# Patient Record
Sex: Male | Born: 1955 | Race: White | Hispanic: No | Marital: Married | Smoking: Never smoker
Health system: Southern US, Community
[De-identification: ages and names within clinical notes are randomized; demographics above are authoritative.]

## PROBLEM LIST (undated history)

## (undated) DIAGNOSIS — N400 Enlarged prostate without lower urinary tract symptoms: Secondary | ICD-10-CM

## (undated) DIAGNOSIS — K259 Gastric ulcer, unspecified as acute or chronic, without hemorrhage or perforation: Secondary | ICD-10-CM

## (undated) DIAGNOSIS — Z681 Body mass index (BMI) 19 or less, adult: Secondary | ICD-10-CM

## (undated) DIAGNOSIS — D4709 Other mast cell neoplasms of uncertain behavior: Secondary | ICD-10-CM

## (undated) DIAGNOSIS — K269 Duodenal ulcer, unspecified as acute or chronic, without hemorrhage or perforation: Secondary | ICD-10-CM

## (undated) DIAGNOSIS — R972 Elevated prostate specific antigen [PSA]: Secondary | ICD-10-CM

## (undated) DIAGNOSIS — R21 Rash and other nonspecific skin eruption: Secondary | ICD-10-CM

## (undated) DIAGNOSIS — E7800 Pure hypercholesterolemia, unspecified: Secondary | ICD-10-CM

## (undated) DIAGNOSIS — K649 Unspecified hemorrhoids: Secondary | ICD-10-CM

## (undated) DIAGNOSIS — K219 Gastro-esophageal reflux disease without esophagitis: Secondary | ICD-10-CM

## (undated) DIAGNOSIS — F419 Anxiety disorder, unspecified: Secondary | ICD-10-CM

## (undated) DIAGNOSIS — L509 Urticaria, unspecified: Secondary | ICD-10-CM

## (undated) DIAGNOSIS — I8390 Asymptomatic varicose veins of unspecified lower extremity: Secondary | ICD-10-CM

## (undated) DIAGNOSIS — F32A Depression, unspecified: Secondary | ICD-10-CM

## (undated) HISTORY — DX: Duodenal ulcer, unspecified as acute or chronic, without hemorrhage or perforation: K26.9

## (undated) HISTORY — DX: Other mast cell neoplasms of uncertain behavior: D47.09

## (undated) HISTORY — DX: Asymptomatic varicose veins of unspecified lower extremity: I83.90

## (undated) HISTORY — DX: Benign prostatic hyperplasia without lower urinary tract symptoms: N40.0

## (undated) HISTORY — DX: Pure hypercholesterolemia, unspecified: E78.00

## (undated) HISTORY — DX: Elevated prostate specific antigen (PSA): R97.20

## (undated) HISTORY — DX: Urticaria, unspecified: L50.9

## (undated) HISTORY — DX: Body mass index (BMI) 19.9 or less, adult: Z68.1

## (undated) HISTORY — DX: Depression, unspecified: F32.A

## (undated) HISTORY — DX: Gastric ulcer, unspecified as acute or chronic, without hemorrhage or perforation: K25.9

## (undated) HISTORY — DX: Rash and other nonspecific skin eruption: R21

## (undated) HISTORY — DX: Unspecified hemorrhoids: K64.9

## (undated) HISTORY — PX: VASECTOMY: SHX75

## (undated) HISTORY — DX: Anxiety disorder, unspecified: F41.9

---

## 2003-10-04 ENCOUNTER — Encounter: Admission: RE | Admit: 2003-10-04 | Discharge: 2003-10-04 | Payer: Self-pay | Admitting: Family Medicine

## 2003-10-06 ENCOUNTER — Encounter: Admission: RE | Admit: 2003-10-06 | Discharge: 2003-10-06 | Payer: Self-pay | Admitting: Family Medicine

## 2003-10-13 ENCOUNTER — Inpatient Hospital Stay (HOSPITAL_COMMUNITY): Admission: RE | Admit: 2003-10-13 | Discharge: 2003-10-14 | Payer: Self-pay | Admitting: Neurosurgery

## 2003-11-26 HISTORY — PX: NECK SURGERY: SHX720

## 2007-12-05 ENCOUNTER — Emergency Department (HOSPITAL_COMMUNITY): Admission: EM | Admit: 2007-12-05 | Discharge: 2007-12-06 | Payer: Self-pay | Admitting: Emergency Medicine

## 2010-12-15 ENCOUNTER — Encounter: Payer: Self-pay | Admitting: Family Medicine

## 2011-04-12 NOTE — Op Note (Signed)
Joshua Haney, Joshua Haney                          ACCOUNT NO.:  [id number]   MEDICAL RECORD NO.:  [id number]                   PATIENT TYPE:  INP   LOCATION:  2899                                 FACILITY:  [facility]   PHYSICIAN:  Clydene Fake, M.D.               DATE OF BIRTH:  1956/07/14   DATE OF PROCEDURE:  10/12/2003  DATE OF DISCHARGE:                                 OPERATIVE REPORT   PREOPERATIVE DIAGNOSIS:  Herniated nucleus pulposus and spondylosis of C5-C6  and C6-C7 with right sided radiculopathy.   POSTOPERATIVE DIAGNOSIS:  Herniated nucleus pulposus and spondylosis of C5-  C6 and C6-C7 with right sided radiculopathy.   PROCEDURE:  Anterior cervical decompression and discectomy C5-C6 and C6-C7  with LifeNet allograft bone and tethered anterior cervical plate.   SURGEON:  Clydene Fake, M.D.   ASSISTANT:  Tressie Stalker, M.D.   ANESTHESIA:  General endotracheal anesthesia.   ESTIMATED BLOOD LOSS:  Minimal, no blood given.   DRAINS:  None.   COMPLICATIONS:  None.   REASON FOR PROCEDURE:  The patient is a 55 year old gentleman who has had  neck pain and intermittent right arm tingling in the past when he was hit  with a garage door on his right arm and had severe right sided neck pain  with pain radiating down his arm with numbness and weakness on the right  side since.  The pain has subsided a little bit, but the weakness persists  and has actually maybe progressed over the last week.  MRI was done showing  severe spondylitic changes at C5-C6 and C6-C7, worse on the left at 6-7,  wide prominent area at +6 with a large right sided foraminal disc herniation  at 5-6, there is some spondylitic changes at 4-5, but that is small.  The  patient is brought in for decompression and fusion at 5-6 and 6-7.   PROCEDURE IN DETAIL:  The patient was brought to the operating room, general  anesthesia was induced, the patient was placed in 10 pounds of halter  traction and was  prepped and draped in the usual sterile fashion.  The site  of incision was injected with 10 cc of 1% lidocaine with epinephrine.  The  incision was then made from the midline to the anterior border of the  sternocleidomastoid muscle.  The incision was taken to the platysma and this  was incised with the Bovie.  Blunt dissection was taken through the anterior  cervical fascia to the anterior cervical spine.  Exposure of the anterior  spine was performed, again with blunt dissection.  A needle was placed in  the interspace and x-ray was obtained showing that this was at the 5-6  interspace.  The disc space was incised with a 15 blade as the needle was  removed and partial discectomy performed with pituitary rongeurs.  The  longus colli muscle was reflected laterally  on each side from C5 through C7  and a self-retaining retractor was placed.  Again the disc spaces of 5-6 and  6-7 were incised with a 15 blade and discectomy performed with pituitary  rongeurs and curets.  The microscope was then brought in for the  microdissection at this point starting at the 5-6 level. The high speed  drill was used to remove cartilaginous end plates. ___________ vertebral  joints, and then 1 and 2 mm Kerrison punches were then used to decompress  ___________ bilateral foramen removing posterior ligament, the rest of the  disc and the posterior spurs.  Bilateral foraminotomies were performed and  free fragments of disc were removed from the right foramen far lateral when  there appears to be good decompression of the bilateral nerve roots and  central canal.  The disc space was measured with the LifeNet ___________  trial and measured to be 6 mm.  We roughed up the endplates with a broach  and after we got hemostasis with Gelfoam and thrombin and irrigating the  Gelfoam out, the 6 mm LifeNet allograft bone was tapped into place.   Attention was then taken to C6-C7 and, again, discectomy was performed with   pituitary rongeurs and curets, 1 and 2 mm Kerrison punches were used to  remove posterior ligament and posterior osteophytes and bilateral  foraminotomies were performed.  The height of the disc space was measured to  be 7 mm.  I removed the cartilaginous end plate with a high speed drill.  I  then used the 7 mm LifeNet broach and then tapped a 7 mm LifeNet allograft  bone into place countersinking a couple of millimeters.  There was plenty of  room between bone and dura, checked with a nerve hook.  The wound was  irrigated with antibiotic solution.  Distraction pins were placed in C5 and  C7 and the interspace was distracted during all this discectomy and  placement of the bone plugs.  The distraction pins were now removed and  weight was removed from the traction.  Hemostasis was obtained with Gelfoam  and thrombin and when this was all irrigated out and removed, a tethered  anterior cervical plate was placed over the anterior cervical spine with two  screws placed in C5, one in C6, and two into C7.  These were tightened down.  A lateral x-ray was obtained showing good position of plate and screws  interbody bone plug C5 through C7.  The wounds were irrigated out with  antibiotic solution, retractors were removed.  Hemostasis was obtained with  bipolar cauterization and Gelfoam and thrombin.  Gelfoam was removed with  good hemostasis.  The platysma was closed with 3-0 Vicryl interrupted  sutures, the subcutaneous tissue were closed with the same, the skin was  closed with Benzoin and Steri-Strips.  A dressing was placed and the patient  was placed in a soft cervical collar, awakened from anesthesia and  transferred to the recovery room in stable condition.                                               Clydene Fake, M.D.    JRH/MEDQ  D:  10/13/2003  T:  10/13/2003  Job:  [id number]

## 2016-04-04 DIAGNOSIS — Z8711 Personal history of peptic ulcer disease: Secondary | ICD-10-CM | POA: Diagnosis not present

## 2016-05-27 DIAGNOSIS — Z0000 Encounter for general adult medical examination without abnormal findings: Secondary | ICD-10-CM | POA: Diagnosis not present

## 2016-06-04 DIAGNOSIS — Z0000 Encounter for general adult medical examination without abnormal findings: Secondary | ICD-10-CM | POA: Diagnosis not present

## 2016-06-04 DIAGNOSIS — Z6825 Body mass index (BMI) 25.0-25.9, adult: Secondary | ICD-10-CM | POA: Diagnosis not present

## 2016-06-04 DIAGNOSIS — K257 Chronic gastric ulcer without hemorrhage or perforation: Secondary | ICD-10-CM | POA: Diagnosis not present

## 2016-06-04 DIAGNOSIS — Z1389 Encounter for screening for other disorder: Secondary | ICD-10-CM | POA: Diagnosis not present

## 2016-09-16 DIAGNOSIS — Z23 Encounter for immunization: Secondary | ICD-10-CM | POA: Diagnosis not present

## 2016-11-12 DIAGNOSIS — H5203 Hypermetropia, bilateral: Secondary | ICD-10-CM | POA: Diagnosis not present

## 2016-11-25 HISTORY — PX: BONE MARROW BIOPSY: SHX199

## 2016-12-14 DIAGNOSIS — T7840XA Allergy, unspecified, initial encounter: Secondary | ICD-10-CM | POA: Diagnosis not present

## 2016-12-24 ENCOUNTER — Ambulatory Visit (INDEPENDENT_AMBULATORY_CARE_PROVIDER_SITE_OTHER): Payer: BLUE CROSS/BLUE SHIELD | Admitting: Allergy

## 2016-12-24 ENCOUNTER — Encounter: Payer: Self-pay | Admitting: Allergy

## 2016-12-24 VITALS — BP 130/82 | HR 80 | Temp 98.1°F | Resp 18 | Ht 66.0 in | Wt 158.0 lb

## 2016-12-24 DIAGNOSIS — L500 Allergic urticaria: Secondary | ICD-10-CM | POA: Diagnosis not present

## 2016-12-24 DIAGNOSIS — J3089 Other allergic rhinitis: Secondary | ICD-10-CM | POA: Diagnosis not present

## 2016-12-24 DIAGNOSIS — T7840XA Allergy, unspecified, initial encounter: Secondary | ICD-10-CM | POA: Diagnosis not present

## 2016-12-24 MED ORDER — EPINEPHRINE 0.3 MG/0.3ML IJ SOAJ: INTRAMUSCULAR | 3 refills | Status: DC

## 2016-12-24 MED ORDER — AUVI-Q 0.3 MG/0.3ML IJ SOAJ: INTRAMUSCULAR | 3 refills | Status: DC

## 2016-12-24 NOTE — Progress Notes (Signed)
New Patient Note  RE: Joshua Haney MRN: [id number] DOB: Jan 25, 1956 Date of Office Visit: 12/24/2016  Referring provider: No ref. provider found Primary care provider: Leonides Sake, MD  Chief Complaint: Rash  History of present illness: Joshua Haney is a 61 y.o. male presenting today for evaluation of rash. He was recommended to see an allergist by the urgent care.   He presents today with his wife.   A week ago he started having a rash.  The rash started on on Friday, 12/13/2016 after he ate cashews.   His wife reports that they don't routinely cashews and typically eat them around the holidays.  The rash started more on his chest and back on Friday.  By the next day the rash was "everywhere".   Wife states the rash looked like hives initially and the hives spread to become large patches of rash.  Rash was very itchy.  [name] reports if he scratched and the rash worsened. Took rash about a week about to completely resolve.  No associated swelling, fever, joint pain. Rash did not leave any marks or bruising once resolved.   She had been using Caladryl on the skin without much relief. He did go to the urgent care on Saturday who prescribed prednisone for 12 days and received today steroid injection.  They also prescribed zantac and benadryl.  Sunday he had red meat in his lunch and supper.   Thus his wife is concerned about possible red meat allergy. About an hour after supper the rash flared up again.  No swelling, difficulty swallowing, respiratory, CV or GI related symptoms.  He denies any new medication, preceding illnesses, stings, changes in soaps or lotions or detergents. He does have a history of tick bites.   He had a bout of hives 2-3 years ago.   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 He reports there is a lot of dust in the year.  Wife reports he does have history of sinus infection.  He has taken Allegra-D in the past to help with his nasal  congestion symptoms.  Review of systems: Review of Systems  Constitutional: Negative for chills, fever and malaise/fatigue.  HENT: Positive for congestion. Negative for ear pain, nosebleeds, sinus pain and sore throat.   Eyes: Negative for discharge and redness.  Respiratory: Negative for cough and shortness of breath.   Cardiovascular: Negative for chest pain.  Gastrointestinal: Positive for heartburn (History of stomach ulcer). Negative for abdominal pain, nausea and vomiting.  Skin: Positive for itching and rash.    All other systems negative unless noted above in HPI  Past medical history: Past Medical History:  Diagnosis Date  . Rash   . Stomach ulcer     Past surgical history: Past Surgical History:  Procedure Laterality Date  . NECK SURGERY  2005  . VASECTOMY      Family history:  Family History  Problem Relation Age of Onset  . Cancer Mother   . Hypertension Father   . Cancer Sister   . Cancer Brother   . Diabetes Paternal Grandmother   . Allergic rhinitis Neg Hx   . Asthma Neg Hx     Social history: Lives with his wife in a home with carpeting with electric heating and central cooling. There is a dog inside the home. There are cats outside the home. There is no concern for water damage or mildew or roaches inside the home.  He was a Clinical research associate and is exposed to fumes, chemicals and dust   Medication List: Allergies as of 12/24/2016   No Known Allergies     Medication List       Accurate as of 12/24/16  5:51 PM. Always use your most recent med list.          AUVI-Q 0.3 mg/0.3 mL Soaj injection Generic drug:  EPINEPHrine Use as directed for life threatening allergic reaction   EPINEPHrine 0.3 mg/0.3 mL Soaj injection Commonly known as:  EPI-PEN Use as directed for life threatening allergic reaction   BENADRYL PO Take by mouth.   pantoprazole 40 MG tablet Commonly known as:  PROTONIX TAKE 1 (ONE) TABLET BY MOUTH EVERY DAY FOR STOMACH     predniSONE 10 MG (48) Tbpk tablet Commonly known as:  STERAPRED UNI-PAK 48 TAB       Known medication allergies: No Known Allergies   Physical examination: Blood pressure 130/82, pulse 80, temperature 98.1 F (36.7 C), temperature source Oral, resp. rate 18, height 5\' 6"  (1.676 m), weight 158 lb (71.7 kg).  General: Alert, interactive, in no acute distress. HEENT: TMs pearly gray, turbinates minimally edematous without discharge, post-pharynx non erythematous. Neck: Supple without lymphadenopathy. Lungs: Clear to auscultation without wheezing, rhonchi or rales. {no increased work of breathing. CV: Normal S1, S2 without murmurs. Abdomen: Nondistended, nontender. Skin: Warm and dry, without lesions or rashes. No urticarial lesions Extremities:  No clubbing, cyanosis or edema. Neuro:   Grossly intact.  Diagnositics/Labs: Allergy testing: Deferred due to recent reaction   Assessment and plan:   Allergic reaction/Urticaria    - his hives/reaction may have been caused by several factors including foods.      - He has a history of urticaria in the past thus this makes him more susceptible to recurrent episodes.  With this is likely that this may be idiopathic in nature    - will obtain labwork as follows: CBC w diff, CMP, tryptase, hive panel, cashew IgE, alpha gal panel   - at this time please continue avoidance of cashews and red meat products until labs return    - will provide with self-injectable epinephrine (epipen, AuviQ) for as needed use in case of allergic reaction.  Follow emergency action plan provided today.      - for management of urticaria recommend Allegra 180mg  1 tab daily.   If you continue to have urticaria increase to 1 tab twice a day.   You can also add on your Zantac 150mg  1 tab twice a day to Allegra if you continue to have symptoms.  He reports he does not like to take medication thus will try to streamline regimen as much as possible.    - let us know if you  have urticaria that occur with fever, joint pains/aches or if urticaria leave any marks or bruising.    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   - will obtain environmental allergen panel   - take Allegra as above   - We'll recommend a nasal spray if Allegra alone is not sufficient  Follow-up 3-4 months or sooner if needed  I appreciate the opportunity to take part in Kiegan's care. Please do not hesitate to contact me with questions.  Sincerely,   Prudy Feeler, MD Allergy/Immunology [facility] [place]

## 2016-12-24 NOTE — Patient Instructions (Addendum)
Allergic reaction/Hives    - your hives/reaction may have been caused by several factors including foods.     - will obtain labwork as follows: CBC w diff, CMP, tryptase, hive panel, cashew IgE, alpha gal panel   - at this time please continue avoidance of cashews and red meat products    - will provide with self-injectable epinephrine (epipen, AuviQ) for as needed use in case of allergic reaction.  Follow action plan.      - for management of rash recommend you take Allegra 180mg  1 tab daily.   If you continue to have hives increase to 1 tab twice a day.   You can also add on your Zantac 150mg  1 tab twice a day to [place] if you continue to have symptoms.    - let us know if you have hives that occur with fever, joint pains/aches or if hives leave any marks or bruising.    Nasal congestion   - will obtain environmental allergen panel   - take Allegra as above  Follow-up 3-4 months or sooner if needed

## 2016-12-31 ENCOUNTER — Other Ambulatory Visit: Payer: Self-pay | Admitting: *Deleted

## 2016-12-31 ENCOUNTER — Other Ambulatory Visit: Payer: Self-pay | Admitting: Allergy

## 2016-12-31 DIAGNOSIS — D8940 Mast cell activation, unspecified: Secondary | ICD-10-CM

## 2016-12-31 LAB — CBC WITH DIFFERENTIAL/PLATELET
Basophils Absolute: 0 10*3/uL (ref 0.0–0.2)
Basos: 0 %
EOS (ABSOLUTE): 0 10*3/uL (ref 0.0–0.4)
Eos: 0 %
Hematocrit: 44.6 % (ref 37.5–51.0)
Hemoglobin: 14.6 g/dL (ref 13.0–17.7)
Immature Grans (Abs): 0.1 10*3/uL (ref 0.0–0.1)
Immature Granulocytes: 1 %
Lymphocytes Absolute: 0.9 10*3/uL (ref 0.7–3.1)
Lymphs: 8 %
MCH: 28.8 pg (ref 26.6–33.0)
MCHC: 32.7 g/dL (ref 31.5–35.7)
MCV: 88 fL (ref 79–97)
Monocytes Absolute: 0.7 10*3/uL (ref 0.1–0.9)
Monocytes: 7 %
Neutrophils Absolute: 9.4 10*3/uL — ABNORMAL HIGH (ref 1.4–7.0)
Neutrophils: 84 %
Platelets: 214 10*3/uL (ref 150–379)
RBC: 5.07 x10E6/uL (ref 4.14–5.80)
RDW: 15 % (ref 12.3–15.4)
WBC: 11.1 10*3/uL — ABNORMAL HIGH (ref 3.4–10.8)

## 2016-12-31 LAB — COMPREHENSIVE METABOLIC PANEL
ALT: 29 IU/L (ref 0–44)
AST: 17 IU/L (ref 0–40)
Albumin/Globulin Ratio: 2.2 (ref 1.2–2.2)
Albumin: 4.1 g/dL (ref 3.6–4.8)
Alkaline Phosphatase: 65 IU/L (ref 39–117)
BUN/Creatinine Ratio: 24 (ref 10–24)
BUN: 27 mg/dL (ref 8–27)
Bilirubin Total: 0.5 mg/dL (ref 0.0–1.2)
CO2: 24 mmol/L (ref 18–29)
Calcium: 8.7 mg/dL (ref 8.6–10.2)
Chloride: 101 mmol/L (ref 96–106)
Creatinine, Ser: 1.11 mg/dL (ref 0.76–1.27)
GFR calc Af Amer: 82 mL/min/{1.73_m2} (ref >59)
GFR calc non Af Amer: 71 mL/min/{1.73_m2} (ref >59)
Globulin, Total: 1.9 g/dL (ref 1.5–4.5)
Glucose: 108 mg/dL — ABNORMAL HIGH (ref 65–99)
Potassium: 4.5 mmol/L (ref 3.5–5.2)
Sodium: 139 mmol/L (ref 134–144)
Total Protein: 6 g/dL (ref 6.0–8.5)

## 2016-12-31 LAB — ALPHA-GAL PANEL
Alpha Gal IgE*: <0.1 kU/L (ref <0.35)
Beef (Bos spp) IgE: 0.11 kU/L (ref <0.35)
Class Interpretation: 0
Class Interpretation: 0
Lamb/Mutton (Ovis spp) IgE: <0.1 kU/L (ref <0.35)
Pork (Sus spp) IgE: <0.1 kU/L (ref <0.35)

## 2016-12-31 LAB — F202-IGE CASHEW NUT: F202-IgE Cashew Nut: <0.1 kU/L

## 2016-12-31 LAB — TRYPTASE: Tryptase: 38.8 ug/L — ABNORMAL HIGH (ref 2.2–13.2)

## 2016-12-31 LAB — TSH: TSH: 1.9 u[IU]/mL (ref 0.450–4.500)

## 2016-12-31 LAB — ALLERGENS W/TOTAL IGE AREA 2
Alternaria Alternata IgE: <0.1 kU/L
Aspergillus Fumigatus IgE: <0.1 kU/L
Bermuda Grass IgE: <0.1 kU/L
Cat Dander IgE: <0.1 kU/L
Cedar, Mountain IgE: <0.1 kU/L
Cladosporium Herbarum IgE: <0.1 kU/L
Cockroach, German IgE: 4.44 kU/L — AB
Common Silver Birch IgE: <0.1 kU/L
Cottonwood IgE: <0.1 kU/L
D Farinae IgE: 0.59 kU/L — AB
D Pteronyssinus IgE: 0.86 kU/L — AB
Dog Dander IgE: <0.1 kU/L
Elm, American IgE: <0.1 kU/L
IgE (Immunoglobulin E), Serum: 108 IU/mL — ABNORMAL HIGH (ref 0–100)
Johnson Grass IgE: <0.1 kU/L
Maple/Box Elder IgE: <0.1 kU/L
Mouse Urine IgE: <0.1 kU/L
Oak, White IgE: <0.1 kU/L
Pecan, Hickory IgE: <0.1 kU/L
Penicillium Chrysogen IgE: <0.1 kU/L
Pigweed, Rough IgE: <0.1 kU/L
Ragweed, Short IgE: <0.1 kU/L
Sheep Sorrel IgE Qn: <0.1 kU/L
Timothy Grass IgE: <0.1 kU/L
White Mulberry IgE: <0.1 kU/L

## 2016-12-31 LAB — CHRONIC URTICARIA: cu index: 3.7 (ref <10)

## 2017-01-11 LAB — CBC WITH DIFFERENTIAL/PLATELET
Basophils Absolute: 0 10*3/uL (ref 0.0–0.2)
Basos: 0 %
EOS (ABSOLUTE): 0.2 10*3/uL (ref 0.0–0.4)
Eos: 5 %
Hematocrit: 42.1 % (ref 37.5–51.0)
Hemoglobin: 13.7 g/dL (ref 13.0–17.7)
Immature Grans (Abs): 0 10*3/uL (ref 0.0–0.1)
Immature Granulocytes: 0 %
Lymphocytes Absolute: 0.8 10*3/uL (ref 0.7–3.1)
Lymphs: 18 %
MCH: 29.3 pg (ref 26.6–33.0)
MCHC: 32.5 g/dL (ref 31.5–35.7)
MCV: 90 fL (ref 79–97)
Monocytes Absolute: 0.4 10*3/uL (ref 0.1–0.9)
Monocytes: 10 %
Neutrophils Absolute: 2.8 10*3/uL (ref 1.4–7.0)
Neutrophils: 67 %
Platelets: 219 10*3/uL (ref 150–379)
RBC: 4.68 x10E6/uL (ref 4.14–5.80)
RDW: 14.8 % (ref 12.3–15.4)
WBC: 4.2 10*3/uL (ref 3.4–10.8)

## 2017-01-13 LAB — TRYPTASE: Tryptase: 30.9 ug/L — ABNORMAL HIGH (ref 2.2–13.2)

## 2017-01-14 ENCOUNTER — Other Ambulatory Visit: Payer: Self-pay

## 2017-01-14 ENCOUNTER — Telehealth: Payer: Self-pay | Admitting: *Deleted

## 2017-01-14 DIAGNOSIS — R748 Abnormal levels of other serum enzymes: Secondary | ICD-10-CM

## 2017-01-14 NOTE — Telephone Encounter (Signed)
I called Joshua Haney wife to discuss his lab results which were notable for a continued elevated tryptase level. I recommended that he get a c-KIT mutational analysis as well. Discussed the referral with Dy[place] our referral coordinator who will help facilitate this appointment. He will likely need a bone marrow biopsy. Reassured his wife that there are excellent treatment options should this turn out to be mastocytosis. Reviewed EpiPen use in case this is needed. His wife also reports that he is having recent hand cramps and leg cramps. He is generally feeling somewhat run down.  His wife reports that they will come by the [place] office to get the lab order tomorrow.   Salvatore Marvel, MD [facility] [place]

## 2017-01-14 NOTE — Telephone Encounter (Signed)
Patient wife called regarding patient labs done last week wanting results. Dr Nelva Bush not in this week but they are eager to find out these results. Will message one of other doctors to address.

## 2017-01-14 NOTE — Telephone Encounter (Deleted)
Hand cramps, leg cramps.

## 2017-01-15 ENCOUNTER — Other Ambulatory Visit: Payer: Self-pay | Admitting: Allergy & Immunology

## 2017-01-15 NOTE — Telephone Encounter (Signed)
That is fine - I will let Dee know.   Salvatore Marvel, MD [place] [facility] [place]

## 2017-01-15 NOTE — Telephone Encounter (Signed)
Dr. Ernst Bowler, Jadon's wife came by to pick up the orders. She had a specific hematologist that she would like for him to see. Dr. Dava Najjar is who they prefer (a family friend gave info).

## 2017-01-20 ENCOUNTER — Telehealth: Payer: Self-pay | Admitting: Allergy

## 2017-01-20 ENCOUNTER — Telehealth: Payer: Self-pay | Admitting: Hematology

## 2017-01-20 ENCOUNTER — Encounter: Payer: Self-pay | Admitting: Hematology

## 2017-01-20 NOTE — Telephone Encounter (Signed)
Spoke to the pt's wife to schedule an appt on 3/13 at 2pm. Aware to arrive 30 minutes early. Demographics verified. Letter mailed to the pt.

## 2017-01-20 NOTE — Telephone Encounter (Signed)
Patient wife requested to know if lab back yet and I advised her it was not

## 2017-01-23 LAB — C-KIT MUTATION, LIQUID TUMOR

## 2017-02-04 ENCOUNTER — Encounter: Payer: Self-pay | Admitting: Hematology

## 2017-02-04 ENCOUNTER — Ambulatory Visit (HOSPITAL_BASED_OUTPATIENT_CLINIC_OR_DEPARTMENT_OTHER): Payer: BLUE CROSS/BLUE SHIELD | Admitting: Hematology

## 2017-02-04 VITALS — BP 149/85 | HR 78 | Temp 98.6°F | Resp 16 | Wt 153.6 lb

## 2017-02-04 DIAGNOSIS — R748 Abnormal levels of other serum enzymes: Secondary | ICD-10-CM

## 2017-02-04 DIAGNOSIS — L509 Urticaria, unspecified: Secondary | ICD-10-CM

## 2017-02-04 NOTE — Progress Notes (Signed)
[name]    HEMATOLOGY/ONCOLOGY CONSULTATION NOTE  Date of Service: 02/04/2017  PCP: Daiva Eves MD Allergy/Immunology: Kennith Gain MD  CHIEF COMPLAINTS/PURPOSE OF CONSULTATION:   Elevated Tryptase levels  HISTORY OF PRESENTING ILLNESS:   Joshua Haney is a wonderful 61 y.o. male who has been referred to [place] by Dr Duard Brady Charmian Muff MD for evaluation and management of elevated tryptase levels to r/o systemic mastocytosis.  Patient notes that he has had a history of urticaria in the past. He was recently seen on 12/24/2016 by an allergy specialist for evaluation of a skin rash. He was noted to have a rash on 12/13/2016 after he ate cashew nuts. He was noted to have a rash predominantly on his chest and back which then progressed to his extremities. The rash looked like hives and were very pruritic. Most of the hives resolved without scarring within 24 hours. He went to urgent care and was given prednisone for a couple weeks and also received a steroid injection. He was also prescribed Zantac and Benadryl. An extensive allergy workup was done which showed elevated IgE levels along with allergies to cockroach, dust mites. He was noted to have an elevated tryptase level of 38.8 on 12/24/2016. Tryptase levels were again repeated on 01/10/2017 and were noted to be persistently elevated at about 31. He was referred to [place] for further evaluation to rule out systemic mastocytosis.   Patient notes that his skin lesions have been better and that he hasn't had significant uncontrolled hives since being on daily Allegra and famotidine.  He notes that even previously as hives did not change color and did not persist for more than 24 hours. He has not had any skin biopsies of his rashes. Patient notes no chest pain or shortness of breath note angioedema and no other focal symptoms at this time. He does have an EpiPen available for uncontrolled allergic reactions.  A blood c-kit mutation test  was done which was noted to be negative.  Patient notes no focal bone pains no abdominal discomfort no enlarged lymph nodes no fevers no chills no night sweats .  no symptoms suggestive of hemodynamic instability along with skin rashes. +  MEDICAL HISTORY:  Past Medical History:  Diagnosis Date  . Rash   . Stomach ulcer   History of Helicobacter pylori positive stomach ulcer GERD History of migraine headaches History of increased PSA at about 10. Patient notes he has had prostate biopsy 2 which was noted to be negative. He has been on Avodart and notes that his PSA levels have come down to 0.2  SURGICAL HISTORY: Past Surgical History:  Procedure Laterality Date  . NECK SURGERY  2005  . VASECTOMY    Allergic plus ways of motor rhinitis   SOCIAL HISTORY: Social History   Social History  . Marital status: Married    Spouse name: N/A  . Number of children: N/A  . Years of education: N/A   Occupational History  . Not on file.   Social History Main Topics  . Smoking status: Never Smoker  . Smokeless tobacco: Never Used  . Alcohol use Not on file  . Drug use: Unknown  . Sexual activity: Not on file   Other Topics Concern  . Not on file   Social History Narrative  . No narrative on file    FAMILY HISTORY: Family History  Problem Relation Age of Onset  . Cancer Mother   . Hypertension Father   . Cancer Sister   .  Cancer Brother   . Diabetes Paternal Grandmother   . Allergic rhinitis Neg Hx   . Asthma Neg Hx   Mother gallbladder cancer Sister. Lymphoma Brother prostate cancer  ALLERGIES:  has No Known Allergies.  MEDICATIONS:  Current Outpatient Prescriptions  Medication Sig Dispense Refill  . AUVI-Q 0.3 MG/0.3ML SOAJ injection Use as directed for life threatening allergic reaction 2 Device 3  . Cyanocobalamin (B-12) 100 MCG TABS Take by mouth daily.    [name] EPINEPHrine 0.3 mg/0.3 mL IJ SOAJ injection Use as directed for life threatening allergic reaction 2  Device 3  . fexofenadine (ALLEGRA) 180 MG tablet Take 180 mg by mouth daily.    [name] glucosamine-chondroitin 500-400 MG tablet Take 1 tablet by mouth daily.    . Multiple Vitamins-Minerals (MENS MULTIVITAMIN PLUS PO) Take by mouth.    . Omega-3 1000 MG CAPS Take by mouth daily.    . pantoprazole (PROTONIX) 40 MG tablet TAKE 1 (ONE) TABLET BY MOUTH EVERY DAY FOR STOMACH  3  . ranitidine (ZANTAC) 150 MG tablet Take 150 mg by mouth daily.    . Saw Palmetto 450 MG CAPS Take by mouth daily.     No current facility-administered medications for this visit.     REVIEW OF SYSTEMS:    10 Point review of Systems was done is negative except as noted above.  PHYSICAL EXAMINATION: ECOG PERFORMANCE STATUS: 1 - Symptomatic but completely ambulatory  . Vitals:   02/04/17 1405  BP: (!) 149/85  Pulse: 78  Resp: 16  Temp: 98.6 F (37 C)   Filed Weights   02/04/17 1405  Weight: 153 lb 9.6 oz (69.7 kg)   .Body mass index is 24.79 kg/m.  GENERAL:alert, in no acute distress and comfortable SKIN - No acute skin rashes at this time EYES: normal, conjunctiva are pink and non-injected, sclera clear OROPHARYNX:no exudate, no erythema and lips, buccal mucosa, and tongue normal  NECK: supple, no JVD, thyroid normal size, non-tender, without nodularity LYMPH:  no palpable lymphadenopathy in the cervical, axillary or inguinal LUNGS: clear to auscultation with normal respiratory effort HEART: regular rate & rhythm,  no murmurs and no lower extremity edema ABDOMEN: abdomen soft, non-tender, normoactive bowel sounds , No palpable hepatosplenomegaly  Musculoskeletal: no cyanosis of digits and no clubbing  PSYCH: alert & oriented x 3 with fluent speech NEURO: no focal motor/sensory deficits  LABORATORY DATA:  I have reviewed the data as listed  . CBC Latest Ref Rng & Units 02/19/2017 01/10/2017 12/24/2016  WBC 4.0 - 10.3 10e3/uL 4.9 4.2 11.1(H)  Hemoglobin 13.0 - 17.1 g/dL 15.1 - -  Hematocrit 38.4 - 49.9  % 45.0 42.1 44.6  Platelets 140 - 400 10e3/uL 200 219 214   . CBC    Component Value Date/Time   WBC 4.9 02/19/2017 0747   RBC 5.05 02/19/2017 0747   HGB 15.1 02/19/2017 0747   HCT 45.0 02/19/2017 0747   PLT 200 02/19/2017 0747   PLT 219 01/10/2017 1642   MCV 89.1 02/19/2017 0747   MCH 29.9 02/19/2017 0747   MCHC 33.6 02/19/2017 0747   RDW 14.2 02/19/2017 0747   LYMPHSABS 0.8 (L) 02/19/2017 0747   MONOABS 0.5 02/19/2017 0747   EOSABS 0.1 02/19/2017 0747   EOSABS 0.2 01/10/2017 1642   BASOSABS 0.0 02/19/2017 0747    . CMP Latest Ref Rng & Units 02/19/2017 02/19/2017 12/24/2016  Glucose 70 - 140 mg/dl 94 - 108(H)  BUN 7.0 - 26.0 mg/dL 17.4 - 27  Creatinine 0.7 - 1.3 mg/dL 1.0 - 1.11  Sodium 136 - 145 mEq/L 142 - 139  Potassium 3.5 - 5.1 mEq/L 4.1 - 4.5  Chloride 96 - 106 mmol/L - - 101  CO2 22 - 29 mEq/L 25 - 24  Calcium 8.4 - 10.4 mg/dL 9.4 - 8.7  Total Protein 6.0 - 8.5 g/dL 7.1 6.7 6.0  Total Bilirubin 0.20 - 1.20 mg/dL 0.80 - 0.5  Alkaline Phos 40 - 150 U/L 97 - 65  AST 5 - 34 U/L 19 - 17  ALT 0 - 55 U/L 24 - 29       . Lab Results  Component Value Date   LDH 331 (H) 02/19/2017     RADIOGRAPHIC STUDIES: I have personally reviewed the radiological images as listed and agreed with the findings in the report. No results found.  ASSESSMENT & PLAN:   61 year old gentleman with   #1  Persistently elevated tryptase levels   repeated tryptase levels and they were still elevated at 29.3.  #2 idiopathic generalized hives. Now controlled with daily H1 and H2 blockers.  Plan -I discussed with the patient that statistically given his presentation this could very well be a chronic idiopathic urticaria. -Considering persistent tryptase levels, elevated LDH and patient's preference would be reasonable to proceed with bone marrow examination to rule out evidence of systemic mastocytosis. -If the bone marrow is negative might need to consider whole body PET CT scan to  rule out other potential lesions related to systemic mastocytosis. -If The patient has any persistent or new skin lesions would recommend biopsying one of the skin lesions to check for mastocytic infiltrates in the skin. -Continue antihistamines as per allergist. -Avoid recognizable environmental triggers  Labs today CT guided bone marrow aspiration and biopsy Return to clinic with Dr. Irene Limbo 5-7 days after bone marrow biopsy.  All of the patients questions were answered with apparent satisfaction. The patient knows to call the clinic with any problems, questions or concerns.  I spent 45 minutes counseling the patient face to face. The total time spent in the appointment was 60 minutes and more than 50% was on counseling and direct patient cares.    Sullivan Lone MD [place] AAHIVMS [facility] Hematology/Oncology Physician [facility]  (Office):       [phone] (Work cell):  [phone] (Fax):           [phone]  02/04/2017 3:00 PM

## 2017-02-08 ENCOUNTER — Telehealth: Payer: Self-pay | Admitting: Hematology

## 2017-02-08 NOTE — Telephone Encounter (Signed)
Scheduled lab appt per sch msg from [facility]. Patient is already aware of appt time.

## 2017-02-18 ENCOUNTER — Other Ambulatory Visit: Payer: Self-pay | Admitting: General Surgery

## 2017-02-19 ENCOUNTER — Ambulatory Visit (HOSPITAL_COMMUNITY)
Admission: RE | Admit: 2017-02-19 | Discharge: 2017-02-19 | Disposition: A | Discharge status: Home / Self Care | Payer: BLUE CROSS/BLUE SHIELD | Source: Ambulatory Visit | Attending: Hematology | Admitting: Hematology

## 2017-02-19 ENCOUNTER — Encounter (HOSPITAL_COMMUNITY): Payer: Self-pay

## 2017-02-19 ENCOUNTER — Other Ambulatory Visit (HOSPITAL_BASED_OUTPATIENT_CLINIC_OR_DEPARTMENT_OTHER): Payer: BLUE CROSS/BLUE SHIELD

## 2017-02-19 DIAGNOSIS — R718 Other abnormality of red blood cells: Secondary | ICD-10-CM | POA: Diagnosis not present

## 2017-02-19 DIAGNOSIS — R748 Abnormal levels of other serum enzymes: Secondary | ICD-10-CM

## 2017-02-19 DIAGNOSIS — Z8719 Personal history of other diseases of the digestive system: Secondary | ICD-10-CM | POA: Insufficient documentation

## 2017-02-19 DIAGNOSIS — K219 Gastro-esophageal reflux disease without esophagitis: Secondary | ICD-10-CM | POA: Diagnosis not present

## 2017-02-19 DIAGNOSIS — R21 Rash and other nonspecific skin eruption: Secondary | ICD-10-CM | POA: Insufficient documentation

## 2017-02-19 DIAGNOSIS — L509 Urticaria, unspecified: Secondary | ICD-10-CM | POA: Diagnosis not present

## 2017-02-19 DIAGNOSIS — D509 Iron deficiency anemia, unspecified: Secondary | ICD-10-CM

## 2017-02-19 DIAGNOSIS — D4709 Other mast cell neoplasms of uncertain behavior: Secondary | ICD-10-CM | POA: Diagnosis not present

## 2017-02-19 HISTORY — DX: Gastro-esophageal reflux disease without esophagitis: K21.9

## 2017-02-19 LAB — COMPREHENSIVE METABOLIC PANEL
ALT: 24 U/L (ref 0–55)
ANION GAP: 10 meq/L (ref 3–11)
AST: 19 U/L (ref 5–34)
Albumin: 4 g/dL (ref 3.5–5.0)
Alkaline Phosphatase: 97 U/L (ref 40–150)
BUN: 17.4 mg/dL (ref 7.0–26.0)
CALCIUM: 9.4 mg/dL (ref 8.4–10.4)
CO2: 25 meq/L (ref 22–29)
CREATININE: 1 mg/dL (ref 0.7–1.3)
Chloride: 108 mEq/L (ref 98–109)
EGFR: 84 mL/min/{1.73_m2} — ABNORMAL LOW (ref >90)
Glucose: 94 mg/dl (ref 70–140)
Potassium: 4.1 mEq/L (ref 3.5–5.1)
Sodium: 142 mEq/L (ref 136–145)
TOTAL PROTEIN: 7.1 g/dL (ref 6.4–8.3)
Total Bilirubin: 0.8 mg/dL (ref 0.20–1.20)

## 2017-02-19 LAB — CBC & DIFF AND RETIC
BASO%: 0.2 % (ref 0.0–2.0)
BASOS ABS: 0 10*3/uL (ref 0.0–0.1)
EOS%: 2.5 % (ref 0.0–7.0)
Eosinophils Absolute: 0.1 10*3/uL (ref 0.0–0.5)
HEMATOCRIT: 45 % (ref 38.4–49.9)
HGB: 15.1 g/dL (ref 13.0–17.1)
IMMATURE RETIC FRACT: 4.9 % (ref 3.00–10.60)
LYMPH#: 0.8 10*3/uL — AB (ref 0.9–3.3)
LYMPH%: 17.1 % (ref 14.0–49.0)
MCH: 29.9 pg (ref 27.2–33.4)
MCHC: 33.6 g/dL (ref 32.0–36.0)
MCV: 89.1 fL (ref 79.3–98.0)
MONO#: 0.5 10*3/uL (ref 0.1–0.9)
MONO%: 9.3 % (ref 0.0–14.0)
NEUT#: 3.4 10*3/uL (ref 1.5–6.5)
NEUT%: 70.9 % (ref 39.0–75.0)
PLATELETS: 200 10*3/uL (ref 140–400)
RBC: 5.05 10*6/uL (ref 4.20–5.82)
RDW: 14.2 % (ref 11.0–14.6)
RETIC CT ABS: 59.59 10*3/uL (ref 34.80–93.90)
Retic %: 1.18 % (ref 0.80–1.80)
WBC: 4.9 10*3/uL (ref 4.0–10.3)

## 2017-02-19 LAB — CHCC SMEAR

## 2017-02-19 LAB — APTT: aPTT: 28 seconds (ref 24–36)

## 2017-02-19 LAB — PROTIME-INR
INR: 0.99
Prothrombin Time: 13.1 seconds (ref 11.4–15.2)

## 2017-02-19 LAB — LACTATE DEHYDROGENASE: LDH: 331 U/L — AB (ref 125–245)

## 2017-02-19 MED ORDER — FENTANYL CITRATE (PF) 100 MCG/2ML IJ SOLN
INTRAMUSCULAR | Status: AC
  Filled 2017-02-19: qty 4

## 2017-02-19 MED ORDER — NALOXONE HCL 0.4 MG/ML IJ SOLN
INTRAMUSCULAR | Status: AC
  Filled 2017-02-19: qty 1

## 2017-02-19 MED ORDER — SODIUM CHLORIDE 0.9 % IV SOLN
INTRAVENOUS | Status: DC
  Administered 2017-02-19: 09:00:00 via INTRAVENOUS

## 2017-02-19 MED ORDER — FENTANYL CITRATE (PF) 100 MCG/2ML IJ SOLN
INTRAMUSCULAR | Status: AC | PRN
  Administered 2017-02-19 (×3): 50 ug via INTRAVENOUS

## 2017-02-19 MED ORDER — MIDAZOLAM HCL 2 MG/2ML IJ SOLN
INTRAMUSCULAR | Status: AC | PRN
  Administered 2017-02-19 (×3): 1 mg via INTRAVENOUS

## 2017-02-19 MED ORDER — MIDAZOLAM HCL 2 MG/2ML IJ SOLN
INTRAMUSCULAR | Status: AC
  Filled 2017-02-19: qty 6

## 2017-02-19 MED ORDER — FLUMAZENIL 0.5 MG/5ML IV SOLN
INTRAVENOUS | Status: AC
  Filled 2017-02-19: qty 5

## 2017-02-19 NOTE — H&P (Signed)
Chief Complaint: Mastocytosis  Referring Physician(s): Brunetta Genera  Supervising Physician: Marybelle Killings  Patient Status: [facility] - Out-pt  History of Present Illness: Joshua Haney is a 61 y.o. male here today for a bone marrow biopsy to evaluate for mastocytosis.  He recently had a severe reaction/rash after eating cashews.  [facility] by Dr. Nelva Bush.  Tryptase was drawn and was elevated = 30.9 (2.2-13.2 is normal range)  He now takes Allegra and Zantac daily for preventative measures and keeps an epi-pen nearby.  He is otherwise healthy for his age other than GERD and history of stomach ulcer.  He is NPO. No blood thinners. No fever/chills or recent rash.  Past Medical History:  Diagnosis Date  . GERD (gastroesophageal reflux disease)   . Rash   . Stomach ulcer     Past Surgical History:  Procedure Laterality Date  . NECK SURGERY  2005  . VASECTOMY      Allergies: Patient has no known allergies.  Medications: Prior to Admission medications   Medication Sig Start Date End Date Taking? Authorizing Provider  AUVI-Q 0.3 MG/0.3ML SOAJ injection Use as directed for life threatening allergic reaction 12/24/16   Shaylar Charmian Muff, MD  Cyanocobalamin (B-12) 100 MCG TABS Take by mouth daily.    Historical Provider, MD  EPINEPHrine 0.3 mg/0.3 mL IJ SOAJ injection Use as directed for life threatening allergic reaction 12/24/16   Shaylar Charmian Muff, MD  fexofenadine (ALLEGRA) 180 MG tablet Take 180 mg by mouth daily.    Historical Provider, MD  glucosamine-chondroitin 500-400 MG tablet Take 1 tablet by mouth daily.    Historical Provider, MD  Multiple Vitamins-Minerals (MENS MULTIVITAMIN PLUS PO) Take by mouth.    Historical Provider, MD  Omega-3 1000 MG CAPS Take by mouth daily.    Historical Provider, MD  pantoprazole (PROTONIX) 40 MG tablet TAKE 1 (ONE) TABLET BY MOUTH EVERY DAY FOR STOMACH 09/28/16   Historical  Provider, MD  ranitidine (ZANTAC) 150 MG tablet Take 150 mg by mouth daily.    Historical Provider, MD  Saw Palmetto 450 MG CAPS Take by mouth daily.    Historical Provider, MD     Family History  Problem Relation Age of Onset  . Cancer Mother   . Hypertension Father   . Cancer Sister   . Cancer Brother   . Diabetes Paternal Grandmother   . Allergic rhinitis Neg Hx   . Asthma Neg Hx     Social History   Social History  . Marital status: Married    Spouse name: N/A  . Number of children: N/A  . Years of education: N/A   Social History Main Topics  . Smoking status: Never Smoker  . Smokeless tobacco: Never Used  . Alcohol use None  . Drug use: Unknown  . Sexual activity: Not Asked   Other Topics Concern  . None   Social History Narrative  . None    Review of Systems: A 12 point ROS discussed   Review of Systems  Constitutional: Negative.   HENT: Negative.   Respiratory: Negative.   Cardiovascular: Negative.   Gastrointestinal: Negative.   Genitourinary: Negative.   Musculoskeletal: Negative.   Skin: Negative.   Neurological: Negative.   Hematological: Negative.   Psychiatric/Behavioral: Negative.     Vital Signs: BP (!) 156/89 (BP Location: Left Arm)   Pulse 67   Temp 98.1 F (36.7 C) (Oral)   Resp 16  SpO2 100%   Physical Exam  Constitutional: He is oriented to person, place, and time. He appears well-developed.  HENT:  Head: Normocephalic and atraumatic.  Eyes: EOM are normal.  Neck: Normal range of motion.  Cardiovascular: Normal rate, regular rhythm and normal heart sounds.   Pulmonary/Chest: Effort normal and breath sounds normal. No respiratory distress. He has no wheezes.  Abdominal: Soft. He exhibits no distension. There is no tenderness.  Musculoskeletal: Normal range of motion.  Neurological: He is alert and oriented to person, place, and time.  Skin: Skin is warm and dry.  Psychiatric: He has a normal mood and affect. His behavior is  normal. Judgment and thought content normal.  Vitals reviewed.   Mallampati Score:  MD Evaluation Airway: WNL Heart: WNL Abdomen: WNL Chest/ Lungs: WNL ASA  Classification: 2 Mallampati/Airway Score: Two  Imaging: No results found.  Labs:  CBC:  Recent Labs  12/24/16 1548 01/10/17 1642 02/19/17 0747  WBC 11.1* 4.2 4.9  HGB  --   --  15.1  HCT 44.6 42.1 45.0  PLT 214 219 200    COAGS:  Recent Labs  02/19/17 0908  INR 0.99  APTT 28    BMP:  Recent Labs  12/24/16 1548 02/19/17 0747  NA 139 142  K 4.5 4.1  CL 101  --   CO2 24 25  GLUCOSE 108* 94  BUN 27 17.4  CALCIUM 8.7 9.4  CREATININE 1.11 1.0  GFRNONAA 71  --   GFRAA 82  --     LIVER FUNCTION TESTS:  Recent Labs  12/24/16 1548 02/19/17 0747  BILITOT 0.5 0.80  AST 17 19  ALT 29 24  ALKPHOS 65 97  PROT 6.0 7.1  ALBUMIN 4.1 4.0    TUMOR MARKERS: No results for input(s): AFPTM, CEA, CA199, CHROMGRNA in the last 8760 hours.  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  Will proceed with bone marrow biopsy today to evaluate for systemic mastocytosis.  Risks and Benefits discussed with the patient including, but not limited to bleeding, infection, damage to adjacent structures or low yield requiring additional tests.  All of the patient's questions were answered, patient is agreeable to proceed. Consent signed and in chart.  Thank you for this interesting consult.  I greatly enjoyed meeting ESAI STECKLEIN and look forward to participating in their care.  A copy of this report was sent to the requesting provider on this date.  Electronically Signed: Murrell Redden PA-C 02/19/2017, 9:39 AM   I spent a total of  30 Minutes in face to face in clinical consultation, greater than 50% of which was counseling/coordinating care for bone marrow biopsy

## 2017-02-19 NOTE — Procedures (Signed)
BM aspirate and core No comp No EBL

## 2017-02-19 NOTE — Discharge Instructions (Signed)
Bone Marrow Aspiration and Bone Marrow Biopsy, Adult, Care After °This sheet gives you information about how to care for yourself after your procedure. Your health care provider may also give you more specific instructions. If you have problems or questions, contact your health care provider. °What can I expect after the procedure? °After the procedure, it is common to have: °· Mild pain and tenderness. °· Swelling. °· Bruising. °Follow these instructions at home: °· Take over-the-counter or prescription medicines only as told by your health care provider. °· Do not take baths, swim, or use a hot tub until your health care provider approves. Ask if you can take a shower or have a sponge bath. °· Follow instructions from your health care provider about how to take care of the puncture site. Make sure you: °¨ Wash your hands with soap and water before you change your bandage (dressing). If soap and water are not available, use hand sanitizer. °¨ Change your dressing as told by your health care provider. °· Check your puncture site every day for signs of infection. Check for: °¨ More redness, swelling, or pain. °¨ More fluid or blood. °¨ Warmth. °¨ Pus or a bad smell. °· Return to your normal activities as told by your health care provider. Ask your health care provider what activities are safe for you. °· Do not drive for 24 hours if you were given a medicine to help you relax (sedative). °· Keep all follow-up visits as told by your health care provider. This is important. °Contact a health care provider if: °· You have more redness, swelling, or pain around the puncture site. °· You have more fluid or blood coming from the puncture site. °· Your puncture site feels warm to the touch. °· You have pus or a bad smell coming from the puncture site. °· You have a fever. °· Your pain is not controlled with medicine. °This information is not intended to replace advice given to you by your health care provider. Make sure you  discuss any questions you have with your health care provider. °Document Released: 05/31/2005 Document Revised: 05/31/2016 Document Reviewed: 04/24/2016 °Elsevier Interactive Patient Education © 2017 [employer] °Moderate Conscious Sedation, Adult, Care After °These instructions provide you with information about caring for yourself after your procedure. Your health care provider may also give you more specific instructions. Your treatment has been planned according to current medical practices, but problems sometimes occur. Call your health care provider if you have any problems or questions after your procedure. °What can I expect after the procedure? °After your procedure, it is common: °· To feel sleepy for several hours. °· To feel clumsy and have poor balance for several hours. °· To have poor judgment for several hours. °· To vomit if you eat too soon. °Follow these instructions at home: °For at least 24 hours after the procedure:  ° °· Do not: °¨ Participate in activities where you could fall or become injured. °¨ Drive. °¨ Use heavy machinery. °¨ Drink alcohol. °¨ Take sleeping pills or medicines that cause drowsiness. °¨ Make important decisions or sign legal documents. °¨ Take care of children on your own. °· Rest. °Eating and drinking  °· Follow the diet recommended by your health care provider. °· If you vomit: °¨ Drink water, juice, or soup when you can drink without vomiting. °¨ Make sure you have little or no nausea before eating solid foods. °General instructions  °· Have a responsible adult stay with you until you   are awake and alert. °· Take over-the-counter and prescription medicines only as told by your health care provider. °· If you smoke, do not smoke without supervision. °· Keep all follow-up visits as told by your health care provider. This is important. °Contact a health care provider if: °· You keep feeling nauseous or you keep vomiting. °· You feel light-headed. °· You develop a  rash. °· You have a fever. °Get help right away if: °· You have trouble breathing. °This information is not intended to replace advice given to you by your health care provider. Make sure you discuss any questions you have with your health care provider. °Document Released: 09/01/2013 Document Revised: 04/15/2016 Document Reviewed: 03/02/2016 °Elsevier Interactive Patient Education © 2017 Elsevier Inc. ° °

## 2017-02-20 ENCOUNTER — Telehealth: Payer: Self-pay | Admitting: Hematology

## 2017-02-20 LAB — C-REACTIVE PROTEIN: CRP: 3.1 mg/L (ref 0.0–4.9)

## 2017-02-20 LAB — SEDIMENTATION RATE: SED RATE: 2 mm/h (ref 0–30)

## 2017-02-20 LAB — TRYPTASE: Tryptase: 29.3 ug/L — ABNORMAL HIGH (ref 2.2–13.2)

## 2017-02-20 NOTE — Telephone Encounter (Signed)
Patient needed f/u appt for after biopsy. Appt scheduled per sch message from [facility] . 02/20/2017

## 2017-02-21 LAB — MULTIPLE MYELOMA PANEL, SERUM
ALBUMIN SERPL ELPH-MCNC: 3.9 g/dL (ref 2.9–4.4)
ALPHA 1: 0.2 g/dL (ref 0.0–0.4)
ALPHA2 GLOB SERPL ELPH-MCNC: 0.7 g/dL (ref 0.4–1.0)
Albumin/Glob SerPl: 1.4 (ref 0.7–1.7)
B-GLOBULIN SERPL ELPH-MCNC: 1.1 g/dL (ref 0.7–1.3)
GAMMA GLOB SERPL ELPH-MCNC: 0.8 g/dL (ref 0.4–1.8)
GLOBULIN, TOTAL: 2.8 g/dL (ref 2.2–3.9)
IgA, Qn, Serum: 166 mg/dL (ref 61–437)
IgG, Qn, Serum: 689 mg/dL — ABNORMAL LOW (ref 700–1600)
IgM, Qn, Serum: 121 mg/dL (ref 20–172)
Total Protein: 6.7 g/dL (ref 6.0–8.5)

## 2017-03-04 LAB — CHROMOSOME ANALYSIS, BONE MARROW

## 2017-03-05 ENCOUNTER — Other Ambulatory Visit: Payer: Self-pay | Admitting: Hematology

## 2017-03-05 ENCOUNTER — Other Ambulatory Visit: Payer: Self-pay | Admitting: *Deleted

## 2017-03-12 ENCOUNTER — Telehealth: Payer: Self-pay | Admitting: Hematology

## 2017-03-12 ENCOUNTER — Encounter (HOSPITAL_COMMUNITY): Payer: Self-pay

## 2017-03-12 ENCOUNTER — Encounter: Payer: Self-pay | Admitting: Hematology

## 2017-03-12 ENCOUNTER — Other Ambulatory Visit: Payer: BLUE CROSS/BLUE SHIELD

## 2017-03-12 ENCOUNTER — Ambulatory Visit (HOSPITAL_BASED_OUTPATIENT_CLINIC_OR_DEPARTMENT_OTHER): Payer: BLUE CROSS/BLUE SHIELD | Admitting: Hematology

## 2017-03-12 VITALS — BP 129/76 | HR 76 | Temp 98.4°F | Resp 18 | Ht 66.0 in | Wt 154.6 lb

## 2017-03-12 DIAGNOSIS — D4709 Other mast cell neoplasms of uncertain behavior: Secondary | ICD-10-CM

## 2017-03-12 DIAGNOSIS — R748 Abnormal levels of other serum enzymes: Secondary | ICD-10-CM | POA: Diagnosis not present

## 2017-03-12 NOTE — Telephone Encounter (Signed)
Lab appointment scheduled per 03/12/17 los. Date, per patient. Patient was given a copy of the AVS report and appointment schedule per 03/12/17 los.

## 2017-03-21 ENCOUNTER — Other Ambulatory Visit: Payer: BLUE CROSS/BLUE SHIELD

## 2017-03-25 ENCOUNTER — Encounter (HOSPITAL_COMMUNITY)
Admission: RE | Admit: 2017-03-25 | Discharge: 2017-03-25 | Disposition: A | Discharge status: Home / Self Care | Payer: BLUE CROSS/BLUE SHIELD | Source: Ambulatory Visit | Attending: Hematology | Admitting: Hematology

## 2017-03-25 ENCOUNTER — Other Ambulatory Visit (HOSPITAL_BASED_OUTPATIENT_CLINIC_OR_DEPARTMENT_OTHER): Payer: BLUE CROSS/BLUE SHIELD

## 2017-03-25 DIAGNOSIS — D4709 Other mast cell neoplasms of uncertain behavior: Secondary | ICD-10-CM | POA: Diagnosis not present

## 2017-03-25 DIAGNOSIS — R748 Abnormal levels of other serum enzymes: Secondary | ICD-10-CM | POA: Diagnosis not present

## 2017-03-25 DIAGNOSIS — C8590 Non-Hodgkin lymphoma, unspecified, unspecified site: Secondary | ICD-10-CM | POA: Diagnosis not present

## 2017-03-25 DIAGNOSIS — N281 Cyst of kidney, acquired: Secondary | ICD-10-CM | POA: Diagnosis not present

## 2017-03-25 LAB — LACTATE DEHYDROGENASE: LDH: 278 U/L — ABNORMAL HIGH (ref 125–245)

## 2017-03-25 LAB — CBC & DIFF AND RETIC
BASO%: 0.5 % (ref 0.0–2.0)
BASOS ABS: 0 10*3/uL (ref 0.0–0.1)
EOS%: 3 % (ref 0.0–7.0)
Eosinophils Absolute: 0.1 10*3/uL (ref 0.0–0.5)
HEMATOCRIT: 44.4 % (ref 38.4–49.9)
HGB: 15 g/dL (ref 13.0–17.1)
IMMATURE RETIC FRACT: 3.5 % (ref 3.00–10.60)
LYMPH#: 0.9 10*3/uL (ref 0.9–3.3)
LYMPH%: 21.7 % (ref 14.0–49.0)
MCH: 29.9 pg (ref 27.2–33.4)
MCHC: 33.8 g/dL (ref 32.0–36.0)
MCV: 88.4 fL (ref 79.3–98.0)
MONO#: 0.5 10*3/uL (ref 0.1–0.9)
MONO%: 11.3 % (ref 0.0–14.0)
NEUT#: 2.8 10*3/uL (ref 1.5–6.5)
NEUT%: 63.5 % (ref 39.0–75.0)
Platelets: 177 10*3/uL (ref 140–400)
RBC: 5.02 10*6/uL (ref 4.20–5.82)
RDW: 13.8 % (ref 11.0–14.6)
RETIC %: 1.14 % (ref 0.80–1.80)
Retic Ct Abs: 57.23 10*3/uL (ref 34.80–93.90)
WBC: 4.3 10*3/uL (ref 4.0–10.3)

## 2017-03-25 LAB — GLUCOSE, CAPILLARY: GLUCOSE-CAPILLARY: 93 mg/dL (ref 65–99)

## 2017-03-25 MED ORDER — FLUDEOXYGLUCOSE F - 18 (FDG) INJECTION
7.7000 | Freq: Once | INTRAVENOUS | Status: AC | PRN
  Administered 2017-03-25: 7.7 via INTRAVENOUS

## 2017-03-25 NOTE — Progress Notes (Signed)
[name]    HEMATOLOGY/ONCOLOGY CLINIC NOTE  Date of Service: .03/12/2017  PCP: Daiva Eves MD Allergy/Immunology: Kennith Gain MD  CHIEF COMPLAINTS/PURPOSE OF CONSULTATION:   Elevated Tryptase levels  HISTORY OF PRESENTING ILLNESS:   Joshua Haney is a wonderful 61 y.o. male who has been referred to [place] by Dr Duard Brady Charmian Muff MD for evaluation and management of elevated tryptase levels to r/o systemic mastocytosis.  Patient notes that he has had a history of urticaria in the past. He was recently seen on 12/24/2016 by an allergy specialist for evaluation of a skin rash. He was noted to have a rash on 12/13/2016 after he ate cashew nuts. He was noted to have a rash predominantly on his chest and back which then progressed to his extremities. The rash looked like hives and were very pruritic. Most of the hives resolved without scarring within 24 hours. He went to urgent care and was given prednisone for a couple weeks and also received a steroid injection. He was also prescribed Zantac and Benadryl. An extensive allergy workup was done which showed elevated IgE levels along with allergies to cockroach, dust mites. He was noted to have an elevated tryptase level of 38.8 on 12/24/2016. Tryptase levels were again repeated on 01/10/2017 and were noted to be persistently elevated at about 31. He was referred to [place] for further evaluation to rule out systemic mastocytosis.   Patient notes that his skin lesions have been better and that he hasn't had significant uncontrolled hives since being on daily Allegra and famotidine.  He notes that even previously as hives did not change color and did not persist for more than 24 hours. He has not had any skin biopsies of his rashes. Patient notes no chest pain or shortness of breath note angioedema and no other focal symptoms at this time. He does have an EpiPen available for uncontrolled allergic reactions.  A blood c-kit mutation test was  done which was noted to be negative.  Patient notes no focal bone pains no abdominal discomfort no enlarged lymph nodes no fevers no chills no night sweats .  no symptoms suggestive of hemodynamic instability along with skin rashes.  INTERVAL HISTORY  Mr Lumley is here to follow up on his bone marrow biopsy result. He notes that his skin rashes and hives have been relatively well-controlled with his antihistamines. Bone marrow biopsy was not bothersome. We discussed his bone marrow biopsy results in details.   MEDICAL HISTORY:  Past Medical History:  Diagnosis Date  . GERD (gastroesophageal reflux disease)   . Rash   . Stomach ulcer   History of Helicobacter pylori positive stomach ulcer GERD History of migraine headaches History of increased PSA at about 10. Patient notes he has had prostate biopsy 2 which was noted to be negative. He has been on Avodart and notes that his PSA levels have come down to 0.2  SURGICAL HISTORY: Past Surgical History:  Procedure Laterality Date  . NECK SURGERY  2005  . VASECTOMY    Allergic plus ways of motor rhinitis   SOCIAL HISTORY: Social History   Social History  . Marital status: Married    Spouse name: N/A  . Number of children: N/A  . Years of education: N/A   Occupational History  . Not on file.   Social History Main Topics  . Smoking status: Never Smoker  . Smokeless tobacco: Never Used  . Alcohol use Not on file  . Drug use: Unknown  .  Sexual activity: Not on file   Other Topics Concern  . Not on file   Social History Narrative  . No narrative on file    FAMILY HISTORY: Family History  Problem Relation Age of Onset  . Cancer Mother   . Hypertension Father   . Cancer Sister   . Cancer Brother   . Diabetes Paternal Grandmother   . Allergic rhinitis Neg Hx   . Asthma Neg Hx   Mother gallbladder cancer Sister. Lymphoma Brother prostate cancer  ALLERGIES:  has No Known Allergies.  MEDICATIONS:  Current  Outpatient Prescriptions  Medication Sig Dispense Refill  . AUVI-Q 0.3 MG/0.3ML SOAJ injection Use as directed for life threatening allergic reaction 2 Device 3  . Cyanocobalamin (B-12) 100 MCG TABS Take by mouth daily.    [name] EPINEPHrine 0.3 mg/0.3 mL IJ SOAJ injection Use as directed for life threatening allergic reaction 2 Device 3  . fexofenadine (ALLEGRA) 180 MG tablet Take 180 mg by mouth daily.    [name] glucosamine-chondroitin 500-400 MG tablet Take 1 tablet by mouth daily.    . Multiple Vitamins-Minerals (MENS MULTIVITAMIN PLUS PO) Take by mouth.    . Omega-3 1000 MG CAPS Take by mouth daily.    . pantoprazole (PROTONIX) 40 MG tablet TAKE 1 (ONE) TABLET BY MOUTH EVERY DAY FOR STOMACH  3  . ranitidine (ZANTAC) 150 MG tablet Take 150 mg by mouth daily.    . Saw Palmetto 450 MG CAPS Take by mouth daily.     No current facility-administered medications for this visit.     REVIEW OF SYSTEMS:    10 Point review of Systems was done is negative except as noted above.  PHYSICAL EXAMINATION: ECOG PERFORMANCE STATUS: 1 - Symptomatic but completely ambulatory  . Vitals:   03/12/17 1425  BP: 129/76  Pulse: 76  Resp: 18  Temp: 98.4 F (36.9 C)   Filed Weights   03/12/17 1425  Weight: 154 lb 9.6 oz (70.1 kg)   .Body mass index is 24.95 kg/m.  GENERAL:alert, in no acute distress and comfortable SKIN - No acute skin rashes at this time EYES: normal, conjunctiva are pink and non-injected, sclera clear OROPHARYNX:no exudate, no erythema and lips, buccal mucosa, and tongue normal  NECK: supple, no JVD, thyroid normal size, non-tender, without nodularity LYMPH:  no palpable lymphadenopathy in the cervical, axillary or inguinal LUNGS: clear to auscultation with normal respiratory effort HEART: regular rate & rhythm,  no murmurs and no lower extremity edema ABDOMEN: abdomen soft, non-tender, normoactive bowel sounds , No palpable hepatosplenomegaly  Musculoskeletal: no cyanosis of digits  and no clubbing  PSYCH: alert & oriented x 3 with fluent speech NEURO: no focal motor/sensory deficits  LABORATORY DATA:  I have reviewed the data as listed  . CBC Latest Ref Rng & Units 02/19/2017 01/10/2017 12/24/2016  WBC 4.0 - 10.3 10e3/uL 4.9 4.2 11.1(H)  Hemoglobin 13.0 - 17.1 g/dL 15.1 - -  Hematocrit 38.4 - 49.9 % 45.0 42.1 44.6  Platelets 140 - 400 10e3/uL 200 219 214   . CBC    Component Value Date/Time   WBC 4.9 02/19/2017 0747   RBC 5.05 02/19/2017 0747   HGB 15.1 02/19/2017 0747   HCT 45.0 02/19/2017 0747   PLT 200 02/19/2017 0747   PLT 219 01/10/2017 1642   MCV 89.1 02/19/2017 0747   MCH 29.9 02/19/2017 0747   MCHC 33.6 02/19/2017 0747   RDW 14.2 02/19/2017 0747   LYMPHSABS 0.8 (L) 02/19/2017 1829  MONOABS 0.5 02/19/2017 0747   EOSABS 0.1 02/19/2017 0747   EOSABS 0.2 01/10/2017 1642   BASOSABS 0.0 02/19/2017 0747    . CMP Latest Ref Rng & Units 02/19/2017 02/19/2017 12/24/2016  Glucose 70 - 140 mg/dl 94 - 108(H)  BUN 7.0 - 26.0 mg/dL 17.4 - 27  Creatinine 0.7 - 1.3 mg/dL 1.0 - 1.11  Sodium 136 - 145 mEq/L 142 - 139  Potassium 3.5 - 5.1 mEq/L 4.1 - 4.5  Chloride 96 - 106 mmol/L - - 101  CO2 22 - 29 mEq/L 25 - 24  Calcium 8.4 - 10.4 mg/dL 9.4 - 8.7  Total Protein 6.0 - 8.5 g/dL 7.1 6.7 6.0  Total Bilirubin 0.20 - 1.20 mg/dL 0.80 - 0.5  Alkaline Phos 40 - 150 U/L 97 - 65  AST 5 - 34 U/L 19 - 17  ALT 0 - 55 U/L 24 - 29       . Lab Results  Component Value Date   LDH 331 (H) 02/19/2017         RADIOGRAPHIC STUDIES: I have personally reviewed the radiological images as listed and agreed with the findings in the report. No results found.  ASSESSMENT & PLAN:   61 year old gentleman with   #1  Persistently elevated tryptase levels   repeated tryptase levels and they were still elevated at 29.3.  #2 Idiopathic generalized hives. Now controlled with daily H1 and H2 blockers.  Plan -We discussed the fact that his bone marrow biopsy shows no  evidence of systemic mastocytosis. Cytogenetics are within normal limits as well. -Cannot rule out the possibility of mast cell activation disorders. -Since his LDH level was elevated we discussed that it would be appropriate to get a PET CT scan to rule out any extra medullary involvement by a mast cell tumor. -If The patient has any persistent or new skin lesions would recommend biopsying one of the skin lesions to check for mastocytic infiltrates in the skin. -Continue antihistamines as per allergist. -Avoid recognizable environmental triggers -A PET/CT scan is negative as well and the patient has severe persistent symptoms might need to consider second opinion at [facility] to rule out mast cell activation syndromes. If present patient might benefit from cromolyn sodium/mast cell stabilizing medications.  PET/CT in 7-10 days Labs in 7-10 days RTC with Dr Irene Limbo based on above results  All of the patients questions were answered with apparent satisfaction. The patient knows to call the clinic with any problems, questions or concerns.  I spent 20 minutes counseling the patient face to face. The total time spent in the appointment was 25 minutes and more than 50% was on counseling and direct patient cares.    Sullivan Lone MD Frederica AAHIVMS [facility] Hematology/Oncology Physician [facility]  (Office):       [phone] (Work cell):  [phone] (Fax):           [phone]

## 2017-03-26 ENCOUNTER — Telehealth: Payer: Self-pay | Admitting: Hematology

## 2017-03-26 NOTE — Telephone Encounter (Signed)
I called the patient's home , as per per previous consent from the patient, I discussed his PET/CT scan results with his wife. PET/CT scan shows no focal lesions concerning for systemic mastocytosis. Repeat LDH levels are downtrending. Repeat PET scan is within normal limits.  Patient has not had any significant episodes of breakout hives while on ongoing antihistamine therapy as per his allergy physicians.  No clear evidence of systemic mastocytosis at this time.  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 Cannot rule out the possibility of cutaneous mastocytosis.  Plan -No indication for further oncology follow-up at this time. -we'll follow his pending tryptase levels  -He should continue to follow with his primary care physician and allergy physician for continued management of his antihistamine therapy. -If symptoms are persistent/he continues to have significant hives/rashes might need to get a skin biopsy. -If persistent bothersome symptoms might need to consider a referral to [facility] for further evaluation and to rule out mast cell activation syndrome. -We will follow-up on an as-needed basis.  Brunetta Genera  MD MS   CLINICAL DATA:  Initial treatment strategy for mastocytosis versus lymphoma.  EXAM: NUCLEAR MEDICINE PET BASE TO THIGH  TECHNIQUE: 7.7 mCi F-18 FDG was injected intravenously. Full-ring PET imaging was performed from the skull base to thigh after the radiotracer. CT data was obtained and used for attenuation correction and anatomic localization.  FASTING BLOOD GLUCOSE:  Value: 93 mg/dl  COMPARISON:  None.  FINDINGS: NECK  No hypermetabolic lymph nodes in the neck.  CHEST  No hypermetabolic mediastinal or hilar nodes. No suspicious pulmonary nodules on the CT scan.  Trace atherosclerotic calcification noted thoracic aorta. No mediastinal lymphadenopathy. There is no axillary lymphadenopathy. 2 mm  posterior left upper lobe pulmonary nodule (image 19 series 8) appears to be calcified and is likely a granuloma.  ABDOMEN/PELVIS  No abnormal hypermetabolic activity within the liver, pancreas, adrenal glands, or spleen. No hypermetabolic lymph nodes in the abdomen or pelvis.  15 mm water density lesion interpolar right kidney appears photopenic on the PET imaging, suggesting cyst. Abdominal aortic atherosclerosis is evident.  SKELETON  No focal hypermetabolic activity to suggest skeletal metastasis.  IMPRESSION: 1. No evidence for unexpected hypermetabolic disease in the neck, chest, abdomen, or pelvis. 2. Mild atherosclerotic calcification noted in the thoracoabdominal aorta, without aneurysm. 3. 15 mm left renal cyst.   Electronically Signed   By: Misty Stanley M.D.   On: 03/25/2017 12:49

## 2017-03-27 LAB — TRYPTASE: Tryptase: 28.6 ug/L — ABNORMAL HIGH (ref 2.2–13.2)

## 2017-03-29 DIAGNOSIS — J309 Allergic rhinitis, unspecified: Secondary | ICD-10-CM | POA: Diagnosis not present

## 2017-05-29 ENCOUNTER — Telehealth: Payer: Self-pay | Admitting: *Deleted

## 2017-05-29 NOTE — Telephone Encounter (Signed)
Pt's wife called stating they received a notice stating PET scan would not be covered by insurance.  PET scan already obtained, pt unable to afford cost of scan.  Pt stated insurance company stated MD would have to appeal.  Wife informed Dr. Irene Limbo out of country until 7/17.  This RN stated office note could be faxed to insurance company to help to provide medical necessity.  Office note faxed to [phone] per wife request.

## 2017-06-05 ENCOUNTER — Telehealth: Payer: Self-pay

## 2017-06-05 NOTE — Telephone Encounter (Signed)
Confirmed fax sent 7/5 with last office note and labs to help in PET scan process.

## 2017-06-24 ENCOUNTER — Telehealth: Payer: Self-pay

## 2017-06-24 NOTE — Telephone Encounter (Signed)
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 [place] will be disregarded and refiled. Message left on answering machine and Pt instructed to call us with any questions.

## 2017-06-24 NOTE — Telephone Encounter (Signed)
Pt's wife called this afternoon regarding CT Scan that was not covered by insurance. Called back and left a message with numbers for Billing: [phone] and toll free number: [phone].

## 2017-06-25 ENCOUNTER — Telehealth: Payer: Self-pay

## 2017-06-25 NOTE — Telephone Encounter (Signed)
Pt's wife called again saying that she spoke with Judeen Hammans in accounting who said there is a note from Dr. Grier Mitts office stating that "PET scan was unneeded and pre-authorization was not required." I passed along known information and communication to Ulice Dash, who said she would do some research into the situation and call pt's wife back.

## 2017-06-27 DIAGNOSIS — Z6825 Body mass index (BMI) 25.0-25.9, adult: Secondary | ICD-10-CM | POA: Diagnosis not present

## 2017-06-27 DIAGNOSIS — Z0000 Encounter for general adult medical examination without abnormal findings: Secondary | ICD-10-CM | POA: Diagnosis not present

## 2017-06-27 DIAGNOSIS — E663 Overweight: Secondary | ICD-10-CM | POA: Diagnosis not present

## 2017-06-27 DIAGNOSIS — Z23 Encounter for immunization: Secondary | ICD-10-CM | POA: Diagnosis not present

## 2017-07-07 ENCOUNTER — Telehealth: Payer: Self-pay | Admitting: *Deleted

## 2017-07-07 NOTE — Telephone Encounter (Signed)
Pt's wife called stating they still have not heard anything back regarding resolution of PET scan bill.   Message left with Gaspar Bidding, but currently out of town through next week.  Last note by Carolanne Grumbling, RN stated Ulice Dash was notified.  This RN sent Ulice Dash an email to follow up for pt.  Will return call to pt if any updates.

## 2017-10-14 DIAGNOSIS — H33101 Unspecified retinoschisis, right eye: Secondary | ICD-10-CM | POA: Diagnosis not present

## 2018-06-09 DIAGNOSIS — J0100 Acute maxillary sinusitis, unspecified: Secondary | ICD-10-CM | POA: Diagnosis not present

## 2018-06-09 DIAGNOSIS — H6122 Impacted cerumen, left ear: Secondary | ICD-10-CM | POA: Diagnosis not present

## 2018-06-09 DIAGNOSIS — H6121 Impacted cerumen, right ear: Secondary | ICD-10-CM | POA: Diagnosis not present

## 2018-08-03 IMAGING — CT NM PET TUM IMG INITIAL (PI) SKULL BASE T - THIGH
1 of 7 series · 1 of 25 positions shown · non-contrast
Comparison: None.

CLINICAL DATA: Initial treatment strategy for mastocytosis versus
lymphoma.

EXAM:
NUCLEAR MEDICINE PET SKULL BASE TO THIGH
TECHNIQUE: 7.7 mCi F-18 FDG was injected intravenously. Full-ring PET imaging
was performed from the skull base to thigh after the radiotracer. CT
data was obtained and used for attenuation correction and anatomic
localization.
FASTING BLOOD GLUCOSE:  Value: 93 mg/dl

[Series 4: ct hn_sk_th 5.0 b31f · axial · 5.0mm · 0.98mm/px · 1 of 217 slices shown]
[im 217/217  brain]
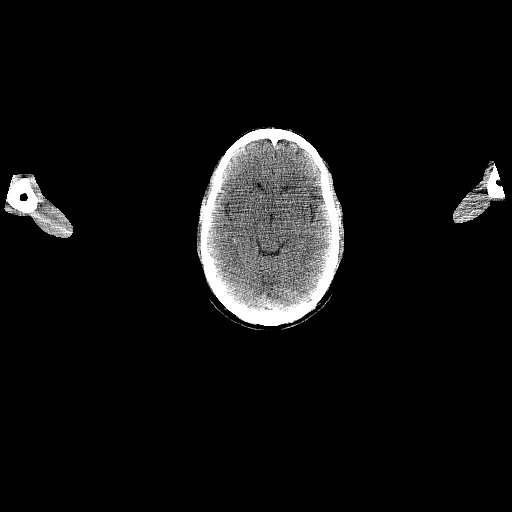

[1 of 25 positions shown; findings below may reference images not displayed]

FINDINGS: NECK

No hypermetabolic lymph nodes in the neck.

CHEST

No hypermetabolic mediastinal or hilar nodes. No suspicious
pulmonary nodules on the CT scan.

Trace atherosclerotic calcification noted thoracic aorta. No
mediastinal lymphadenopathy. There is no axillary lymphadenopathy. 2
mm posterior left upper lobe pulmonary nodule (image 19 series 8)
appears to be calcified and is likely a granuloma.

ABDOMEN/PELVIS

No abnormal hypermetabolic activity within the liver, pancreas,
adrenal glands, or spleen. No hypermetabolic lymph nodes in the
abdomen or pelvis.

15 mm water density lesion interpolar right kidney appears
photopenic on the PET imaging, suggesting cyst. Abdominal aortic
atherosclerosis is evident.

SKELETON

No focal hypermetabolic activity to suggest skeletal metastasis.
IMPRESSION: 1. No evidence for unexpected hypermetabolic disease in the neck,
chest, abdomen, or pelvis.
2. Mild atherosclerotic calcification noted in the thoracoabdominal
aorta, without aneurysm.
3. 15 mm left renal cyst.

## 2018-10-28 DIAGNOSIS — J0100 Acute maxillary sinusitis, unspecified: Secondary | ICD-10-CM | POA: Diagnosis not present

## 2018-11-17 DIAGNOSIS — E785 Hyperlipidemia, unspecified: Secondary | ICD-10-CM | POA: Diagnosis not present

## 2018-11-17 DIAGNOSIS — Z0000 Encounter for general adult medical examination without abnormal findings: Secondary | ICD-10-CM | POA: Diagnosis not present

## 2018-11-17 DIAGNOSIS — Z1331 Encounter for screening for depression: Secondary | ICD-10-CM | POA: Diagnosis not present

## 2018-11-17 DIAGNOSIS — K257 Chronic gastric ulcer without hemorrhage or perforation: Secondary | ICD-10-CM | POA: Diagnosis not present

## 2018-11-17 DIAGNOSIS — Z79899 Other long term (current) drug therapy: Secondary | ICD-10-CM | POA: Diagnosis not present

## 2019-03-12 DIAGNOSIS — H43811 Vitreous degeneration, right eye: Secondary | ICD-10-CM | POA: Diagnosis not present

## 2019-03-30 DIAGNOSIS — H43811 Vitreous degeneration, right eye: Secondary | ICD-10-CM | POA: Diagnosis not present

## 2019-06-03 ENCOUNTER — Ambulatory Visit: Payer: BLUE CROSS/BLUE SHIELD | Admitting: Allergy and Immunology

## 2019-11-25 DIAGNOSIS — Z1331 Encounter for screening for depression: Secondary | ICD-10-CM | POA: Diagnosis not present

## 2019-11-25 DIAGNOSIS — Z79899 Other long term (current) drug therapy: Secondary | ICD-10-CM | POA: Diagnosis not present

## 2019-11-25 DIAGNOSIS — K257 Chronic gastric ulcer without hemorrhage or perforation: Secondary | ICD-10-CM | POA: Diagnosis not present

## 2019-11-25 DIAGNOSIS — K267 Chronic duodenal ulcer without hemorrhage or perforation: Secondary | ICD-10-CM | POA: Diagnosis not present

## 2019-11-25 DIAGNOSIS — Z23 Encounter for immunization: Secondary | ICD-10-CM | POA: Diagnosis not present

## 2019-11-25 DIAGNOSIS — Z0000 Encounter for general adult medical examination without abnormal findings: Secondary | ICD-10-CM | POA: Diagnosis not present

## 2019-11-25 DIAGNOSIS — E785 Hyperlipidemia, unspecified: Secondary | ICD-10-CM | POA: Diagnosis not present

## 2019-11-25 DIAGNOSIS — Z13220 Encounter for screening for lipoid disorders: Secondary | ICD-10-CM | POA: Diagnosis not present

## 2019-11-25 DIAGNOSIS — Z9181 History of falling: Secondary | ICD-10-CM | POA: Diagnosis not present

## 2020-03-13 DIAGNOSIS — F418 Other specified anxiety disorders: Secondary | ICD-10-CM | POA: Diagnosis not present

## 2020-03-13 DIAGNOSIS — Z6825 Body mass index (BMI) 25.0-25.9, adult: Secondary | ICD-10-CM | POA: Diagnosis not present

## 2020-03-14 DIAGNOSIS — F418 Other specified anxiety disorders: Secondary | ICD-10-CM | POA: Diagnosis not present

## 2020-03-14 DIAGNOSIS — Z6825 Body mass index (BMI) 25.0-25.9, adult: Secondary | ICD-10-CM | POA: Diagnosis not present

## 2020-03-14 DIAGNOSIS — D4709 Other mast cell neoplasms of uncertain behavior: Secondary | ICD-10-CM | POA: Diagnosis not present

## 2020-03-14 DIAGNOSIS — L509 Urticaria, unspecified: Secondary | ICD-10-CM | POA: Diagnosis not present

## 2020-03-28 DIAGNOSIS — Z1211 Encounter for screening for malignant neoplasm of colon: Secondary | ICD-10-CM | POA: Diagnosis not present

## 2020-03-28 DIAGNOSIS — F418 Other specified anxiety disorders: Secondary | ICD-10-CM | POA: Diagnosis not present

## 2020-03-28 DIAGNOSIS — L509 Urticaria, unspecified: Secondary | ICD-10-CM | POA: Diagnosis not present

## 2020-03-28 DIAGNOSIS — Z6825 Body mass index (BMI) 25.0-25.9, adult: Secondary | ICD-10-CM | POA: Diagnosis not present

## 2020-03-30 DIAGNOSIS — H25813 Combined forms of age-related cataract, bilateral: Secondary | ICD-10-CM | POA: Diagnosis not present

## 2020-04-28 DIAGNOSIS — E7800 Pure hypercholesterolemia, unspecified: Secondary | ICD-10-CM | POA: Diagnosis not present

## 2020-04-28 DIAGNOSIS — R413 Other amnesia: Secondary | ICD-10-CM | POA: Diagnosis not present

## 2020-09-12 DIAGNOSIS — J029 Acute pharyngitis, unspecified: Secondary | ICD-10-CM | POA: Diagnosis not present

## 2020-09-12 DIAGNOSIS — J019 Acute sinusitis, unspecified: Secondary | ICD-10-CM | POA: Diagnosis not present

## 2020-11-21 DIAGNOSIS — Z0000 Encounter for general adult medical examination without abnormal findings: Secondary | ICD-10-CM | POA: Diagnosis not present

## 2020-11-21 DIAGNOSIS — Z139 Encounter for screening, unspecified: Secondary | ICD-10-CM | POA: Diagnosis not present

## 2020-11-21 DIAGNOSIS — K267 Chronic duodenal ulcer without hemorrhage or perforation: Secondary | ICD-10-CM | POA: Diagnosis not present

## 2020-11-21 DIAGNOSIS — K257 Chronic gastric ulcer without hemorrhage or perforation: Secondary | ICD-10-CM | POA: Diagnosis not present

## 2020-11-21 DIAGNOSIS — E785 Hyperlipidemia, unspecified: Secondary | ICD-10-CM | POA: Diagnosis not present

## 2021-02-01 DIAGNOSIS — R0602 Shortness of breath: Secondary | ICD-10-CM | POA: Diagnosis not present

## 2021-02-01 DIAGNOSIS — R0683 Snoring: Secondary | ICD-10-CM | POA: Diagnosis not present

## 2021-02-01 DIAGNOSIS — R5383 Other fatigue: Secondary | ICD-10-CM | POA: Diagnosis not present

## 2021-03-19 DIAGNOSIS — R0602 Shortness of breath: Secondary | ICD-10-CM | POA: Diagnosis not present

## 2021-03-19 DIAGNOSIS — Z9181 History of falling: Secondary | ICD-10-CM | POA: Diagnosis not present

## 2021-03-19 DIAGNOSIS — G4730 Sleep apnea, unspecified: Secondary | ICD-10-CM | POA: Diagnosis not present

## 2021-03-22 ENCOUNTER — Telehealth: Payer: Self-pay

## 2021-03-22 NOTE — Telephone Encounter (Signed)
Referral notes sent from [facility], Phone #: [phone], Fax #: [phone]   Notes sent to scheduling

## 2021-04-02 ENCOUNTER — Other Ambulatory Visit: Payer: Self-pay

## 2021-04-02 ENCOUNTER — Encounter: Payer: Self-pay | Admitting: Cardiology

## 2021-04-02 ENCOUNTER — Ambulatory Visit: Payer: Medicare Other | Admitting: Cardiology

## 2021-04-02 VITALS — BP 140/84 | HR 68 | Ht 66.0 in | Wt 175.0 lb

## 2021-04-02 DIAGNOSIS — R0600 Dyspnea, unspecified: Secondary | ICD-10-CM

## 2021-04-02 DIAGNOSIS — R0609 Other forms of dyspnea: Secondary | ICD-10-CM

## 2021-04-02 DIAGNOSIS — I4510 Unspecified right bundle-branch block: Secondary | ICD-10-CM

## 2021-04-02 NOTE — Patient Instructions (Signed)
Medication Instructions:  Your physician recommends that you continue on your current medications as directed. Please refer to the Current Medication list given to you today.  *If you need a refill on your cardiac medications before your next appointment, please call your pharmacy*  Testing/Procedures: Your physician has requested that you have an echocardiogram. Echocardiography is a painless test that uses sound waves to create images of your heart. It provides your doctor with information about the size and shape of your heart and how well your heart's chambers and valves are working. This procedure takes approximately one hour. There are no restrictions for this procedure.  Your physician has requested that you have a stress myoview.    Follow-Up: At [facility], you and your health needs are our priority.  As part of our continuing mission to provide you with exceptional heart care, we have created designated Provider Care Teams.  These Care Teams include your primary Cardiologist (physician) and Advanced Practice Providers (APPs -  Physician Assistants and Nurse Practitioners) who all work together to provide you with the care you need, when you need it.  Follow up with Dr. Radford Pax as needed based on results of testing.

## 2021-04-02 NOTE — Addendum Note (Signed)
Addended by: Antonieta Iba on: 04/02/2021 12:47 PM   Modules accepted: Orders

## 2021-04-02 NOTE — Progress Notes (Addendum)
Cardiology Consult  Note    Date:  04/02/2021   ID:  Joshua Haney, DOB 1956-09-18, MRN [id number]  PCP:  Cyndi Bender, PA-C  Cardiologist:  Fransico Him, MD   Chief Complaint  Patient presents with  . New Patient (Initial Visit)    DOE    History of Present Illness:  Joshua Haney is a 65 y.o. male who is being seen today for the evaluation of SOB at the request of Cyndi Bender, [place].  This is a 65yo male with a hx of depression, duodenal ulcer, gastric ulcer, GERD and HLD who is referred for evaluation of SOB. He says that he has been having DOE and decreased exercise tolerance over the past 6 months.  He has also had a sleep study recently which showed OSA and is waiting for a device.  He denies any chest pain or pressure, PND, orthopnea, LE edema, palpitations or syncope.  His brother had an MI in his 39's.  He has never smoked.    Past Medical History:  Diagnosis Date  . Anxiety   . Body mass index (BMI) of 19.0 to 19.9 in adult   . BPH (benign prostatic hyperplasia)   . Depression   . Duodenal ulcer   . Elevated PSA   . Gastric ulcer   . GERD (gastroesophageal reflux disease)   . Hemorrhoids   . Hives   . Hypercholesteremia   . Mastocytosis   . Rash   . Stomach ulcer   . Varicose vein of leg     Past Surgical History:  Procedure Laterality Date  . NECK SURGERY  2005  . VASECTOMY      Current Medications: Current Meds  Medication Sig  . AUVI-Q 0.3 MG/0.3ML SOAJ injection Use as directed for life threatening allergic reaction  . Cyanocobalamin (B-12) 100 MCG TABS Take by mouth daily.  [name] EPINEPHrine 0.3 mg/0.3 mL IJ SOAJ injection Use as directed for life threatening allergic reaction  . fexofenadine (ALLEGRA) 180 MG tablet Take 180 mg by mouth daily.  [name] FIBER PO Take 500 mg by mouth.  . Garlic 0932 MG CAPS Take by mouth.  [name] glucosamine-chondroitin 500-400 MG tablet Take 1 tablet by mouth daily.  . Multiple Vitamins-Minerals (MENS MULTIVITAMIN PLUS PO)  Take by mouth.  . Omega-3 1000 MG CAPS Take by mouth daily.  . pantoprazole (PROTONIX) 40 MG tablet TAKE 1 (ONE) TABLET BY MOUTH EVERY DAY FOR STOMACH  . rosuvastatin (CRESTOR) 10 MG tablet Take 10 mg by mouth daily.  . Saw Palmetto 450 MG CAPS Take by mouth daily.  . Saw Palmetto, Serenoa repens, (SAW PALMETTO PO) Take by mouth.  [name] VITAMIN D PO Take 25 mcg by mouth.  . Zoster Vaccine Adjuvanted [facility]) injection Inject 0.5 mLs into the muscle once.    Allergies:   Patient has no known allergies.   Social History   Socioeconomic History  . Marital status: Married    Spouse name: Not on file  . Number of children: Not on file  . Years of education: Not on file  . Highest education level: Not on file  Occupational History  . Not on file  Tobacco Use  . Smoking status: Never Smoker  . Smokeless tobacco: Never Used  Substance and Sexual Activity  . Alcohol use: Not on file  . Drug use: Not on file  . Sexual activity: Not on file  Other Topics Concern  . Not on file  Social History Narrative  .  Not on file   Social Determinants of Health   Financial Resource Strain: Not on file  Food Insecurity: Not on file  Transportation Needs: Not on file  Physical Activity: Not on file  Stress: Not on file  Social Connections: Not on file     Family History:  The patient's family history includes CAD in his brother; Cancer in his brother, mother, and sister; Diabetes in his paternal grandmother; Hypertension in his father.   ROS:   Please see the history of present illness.    ROS All other systems reviewed and are negative.  No flowsheet data found.     PHYSICAL EXAM:   VS:  BP 140/84   Pulse 68   Ht 5\' 6"  (1.676 m)   Wt 175 lb (79.4 kg)   SpO2 96%   BMI 28.25 kg/m    GEN: Well nourished, well developed, in no acute distress  HEENT: normal  Neck: no JVD, carotid bruits, or masses Cardiac: RRR; no murmurs, rubs, or gallops,no edema.  Intact distal pulses  bilaterally.  Respiratory:  clear to auscultation bilaterally, normal work of breathing GI: soft, nontender, nondistended, + BS MS: no deformity or atrophy  Skin: warm and dry, no rash Neuro:  Alert and Oriented x 3, Strength and sensation are intact Psych: euthymic mood, full affect  Wt Readings from Last 3 Encounters:  04/02/21 175 lb (79.4 kg)  03/12/17 154 lb 9.6 oz (70.1 kg)  02/19/17 153 lb 9.6 oz (69.7 kg)      Studies/Labs Reviewed:   EKG:  EKG is ordered today.  The ekg  and demonstrates NSR with iRBBB and LAFB  Recent Labs: No results found for requested labs within last 8760 hours.   Lipid Panel No results found for: CHOL, TRIG, HDL, CHOLHDL, VLDL, LDLCALC, LDLDIRECT   Additional studies/ records that were reviewed today include:  none    ASSESSMENT:    1. DOE (dyspnea on exertion)   2. Incomplete RBBB      PLAN:  In order of problems listed above:  DOE -he has no hx of tobacco use but was exposed to second hand smoke growing up -his CRFs include fm hx of CAD but later age in life and HLD -EKG shows iRBBB and LAFB -I will get a stress myyoview to rule out ischemia -Shared Decision Making/Informed Consent The risks [chest pain, shortness of breath, cardiac arrhythmias, dizziness, blood pressure fluctuations, myocardial infarction, stroke/transient ischemic attack, nausea, vomiting, allergic reaction, radiation exposure, metallic taste sensation and life-threatening complications (estimated to be 1 in 10,000)], benefits (risk stratification, diagnosing coronary artery disease, treatment guidance) and alternatives of a nuclear stress test were discussed in detail with Mr. Lehrke and he agrees to proceed. -check 2D echo to assess LVF -? Whether the OSA may be contributing to his fatigue and decreased exercise tolerance -check coronary Ca score  IRBBB/LAFB -noted on EKG at PCP -see #1   Medication Adjustments/Labs and Tests Ordered: Current medicines are  reviewed at length with the patient today.  Concerns regarding medicines are outlined above.  Medication changes, Labs and Tests ordered today are listed in the Patient Instructions below.  There are no Patient Instructions on file for this visit.   Signed, Fransico Him, MD  04/02/2021 12:40 PM    [facility] [address], [place], [place]  [address] Phone: [phone]; Fax: [phone]

## 2021-04-02 NOTE — Addendum Note (Signed)
Addended by: Antonieta Iba on: 04/02/2021 12:52 PM   Modules accepted: Orders

## 2021-04-03 DIAGNOSIS — H25813 Combined forms of age-related cataract, bilateral: Secondary | ICD-10-CM | POA: Diagnosis not present

## 2021-04-09 NOTE — Addendum Note (Signed)
Addended by: Sueanne Margarita on: 04/09/2021 09:19 AM   Modules accepted: Orders

## 2021-04-25 ENCOUNTER — Telehealth (HOSPITAL_COMMUNITY): Payer: Self-pay | Admitting: *Deleted

## 2021-04-25 ENCOUNTER — Encounter (HOSPITAL_COMMUNITY): Payer: Self-pay | Admitting: *Deleted

## 2021-04-25 NOTE — Telephone Encounter (Signed)
Left message on voicemail in reference to upcoming appointment scheduled for 05/01/21. Phone number given for a call back so details instructions can be given.  Letter sent via my chart.  Kirstie Peri

## 2021-04-26 ENCOUNTER — Telehealth (HOSPITAL_COMMUNITY): Payer: Self-pay | Admitting: *Deleted

## 2021-04-26 NOTE — Telephone Encounter (Signed)
Patient given detailed instructions per Myocardial Perfusion Study Information Sheet for the test on 05/01/21. Patient notified to arrive 15 minutes early and that it is imperative to arrive on time for appointment to keep from having the test rescheduled.  If you need to cancel or reschedule your appointment, please call the office within 24 hours of your appointment. . Patient verbalized understanding. Kirstie Peri

## 2021-05-01 ENCOUNTER — Other Ambulatory Visit: Payer: Self-pay

## 2021-05-01 ENCOUNTER — Ambulatory Visit (HOSPITAL_COMMUNITY): Payer: Medicare Other | Attending: Internal Medicine

## 2021-05-01 ENCOUNTER — Ambulatory Visit (INDEPENDENT_AMBULATORY_CARE_PROVIDER_SITE_OTHER)
Admission: RE | Admit: 2021-05-01 | Discharge: 2021-05-01 | Disposition: A | Discharge status: Home / Self Care | Payer: Self-pay | Source: Ambulatory Visit | Attending: Cardiology | Admitting: Cardiology

## 2021-05-01 ENCOUNTER — Other Ambulatory Visit: Payer: Medicare Other

## 2021-05-01 ENCOUNTER — Ambulatory Visit (HOSPITAL_BASED_OUTPATIENT_CLINIC_OR_DEPARTMENT_OTHER): Payer: Medicare Other

## 2021-05-01 ENCOUNTER — Telehealth: Payer: Self-pay | Admitting: Cardiology

## 2021-05-01 DIAGNOSIS — Z79899 Other long term (current) drug therapy: Secondary | ICD-10-CM

## 2021-05-01 DIAGNOSIS — R0600 Dyspnea, unspecified: Secondary | ICD-10-CM

## 2021-05-01 DIAGNOSIS — R0609 Other forms of dyspnea: Secondary | ICD-10-CM

## 2021-05-01 LAB — BASIC METABOLIC PANEL
BUN/Creatinine Ratio: 15 (ref 10–24)
BUN: 18 mg/dL (ref 8–27)
CO2: 22 mmol/L (ref 20–29)
Calcium: 8.8 mg/dL (ref 8.6–10.2)
Chloride: 106 mmol/L (ref 96–106)
Creatinine, Ser: 1.2 mg/dL (ref 0.76–1.27)
Glucose: 90 mg/dL (ref 65–99)
Potassium: 4.7 mmol/L (ref 3.5–5.2)
Sodium: 141 mmol/L (ref 134–144)
eGFR: 67 mL/min/{1.73_m2} (ref >59)

## 2021-05-01 LAB — MYOCARDIAL PERFUSION IMAGING
Estimated workload: 6.8 METS
Exercise duration (min): 5 min
Exercise duration (sec): 3 s
LV dias vol: 58 mL (ref 62–150)
LV sys vol: 21 mL
MPHR: 154 {beats}/min
Peak HR: 141 {beats}/min
Percent HR: 91 %
Rest HR: 75 {beats}/min
SDS: 0
SRS: 0
SSS: 0
TID: 0.76

## 2021-05-01 LAB — ECHOCARDIOGRAM COMPLETE
Area-P 1/2: 2.83 cm2
Height: 66 in
S' Lateral: 2.73 cm
Weight: 2800 oz

## 2021-05-01 LAB — PRO B NATRIURETIC PEPTIDE: NT-Pro BNP: 130 pg/mL (ref 0–376)

## 2021-05-01 LAB — TSH: TSH: 3.98 u[IU]/mL (ref 0.450–4.500)

## 2021-05-01 MED ORDER — TECHNETIUM TC 99M TETROFOSMIN IV KIT
10.9000 | PACK | Freq: Once | INTRAVENOUS | Status: AC | PRN
  Administered 2021-05-01: 10.9 via INTRAVENOUS
  Filled 2021-05-01: qty 11

## 2021-05-01 MED ORDER — TECHNETIUM TC 99M TETROFOSMIN IV KIT
31.5000 | PACK | Freq: Once | INTRAVENOUS | Status: AC | PRN
  Administered 2021-05-01: 31.5 via INTRAVENOUS
  Filled 2021-05-01: qty 32

## 2021-05-01 NOTE — Telephone Encounter (Signed)
Pt. Is here today for an echo, myo and Ct. His wife says that he has gained 4 puonds overnight and he has some facial swelling that she is concerned about.

## 2021-05-01 NOTE — Telephone Encounter (Signed)
Pt was here for his testing and his wife reported that he gained 4 lbs over night last night.. he appeared comfortable at rest when talking with him.. per Dr.Turner we added on a BNP, BMET, and TSH today. Pt to watch his NA intake.

## 2021-05-02 ENCOUNTER — Telehealth: Payer: Self-pay

## 2021-05-02 DIAGNOSIS — I10 Essential (primary) hypertension: Secondary | ICD-10-CM

## 2021-05-02 DIAGNOSIS — R0609 Other forms of dyspnea: Secondary | ICD-10-CM

## 2021-05-02 DIAGNOSIS — Z79899 Other long term (current) drug therapy: Secondary | ICD-10-CM

## 2021-05-02 NOTE — Telephone Encounter (Signed)
-----   Message from Sueanne Margarita, MD sent at 05/02/2021  8:34 AM EDT ----- Please let patient know that stress test was fine.  His BNP was also normal - please refer to Pulmonary for evaluation of SOB.  Also please get  a 48 hour BP monitor as he had a hypertensive BP response to exercise

## 2021-05-02 NOTE — Telephone Encounter (Signed)
The patient's wife has been notified of results and verbalized understanding. Referral to pulmonology has been placed and BP monitor has been ordered.

## 2021-05-08 ENCOUNTER — Other Ambulatory Visit: Payer: Self-pay

## 2021-05-08 ENCOUNTER — Ambulatory Visit (INDEPENDENT_AMBULATORY_CARE_PROVIDER_SITE_OTHER): Payer: Medicare Other

## 2021-05-08 DIAGNOSIS — I10 Essential (primary) hypertension: Secondary | ICD-10-CM

## 2021-05-15 ENCOUNTER — Other Ambulatory Visit: Payer: Medicare Other | Admitting: *Deleted

## 2021-05-15 ENCOUNTER — Other Ambulatory Visit: Payer: Self-pay

## 2021-05-15 DIAGNOSIS — Z79899 Other long term (current) drug therapy: Secondary | ICD-10-CM | POA: Diagnosis not present

## 2021-05-15 LAB — LIPID PANEL
Chol/HDL Ratio: 5 ratio (ref 0.0–5.0)
Cholesterol, Total: 140 mg/dL (ref 100–199)
HDL: 28 mg/dL — ABNORMAL LOW (ref >39)
LDL Chol Calc (NIH): 54 mg/dL (ref 0–99)
Triglycerides: 381 mg/dL — ABNORMAL HIGH (ref 0–149)
VLDL Cholesterol Cal: 58 mg/dL — ABNORMAL HIGH (ref 5–40)

## 2021-05-15 LAB — ALT: ALT: 28 IU/L (ref 0–44)

## 2021-05-16 ENCOUNTER — Telehealth: Payer: Self-pay

## 2021-05-16 DIAGNOSIS — Z79899 Other long term (current) drug therapy: Secondary | ICD-10-CM

## 2021-05-16 MED ORDER — FENOFIBRATE 48 MG PO TABS: 48.0000 mg | ORAL_TABLET | Freq: Every day | ORAL | 3 refills | Status: DC

## 2021-05-16 NOTE — Telephone Encounter (Signed)
Spoke with the patient's wife and advised her on recommendations for fenofibrate 48mg  daily. Labs have been ordered and she will call back to schedule once she is back home.

## 2021-05-16 NOTE — Telephone Encounter (Signed)
-----   Message from Leeroy Bock, [place] sent at 05/16/2021  2:55 PM EDT ----- Recommend adding fenofibrate 48mg  once daily (lower dose recommended based on CrCl in the 60s) and continue rosuvastatin 10mg  daily. Would recheck lipids and direct LDL in 2-3 months.

## 2021-05-22 ENCOUNTER — Other Ambulatory Visit: Payer: Self-pay

## 2021-05-22 ENCOUNTER — Encounter: Payer: Self-pay | Admitting: Allergy & Immunology

## 2021-05-22 ENCOUNTER — Ambulatory Visit: Payer: Medicare Other | Admitting: Allergy & Immunology

## 2021-05-22 VITALS — BP 142/80 | HR 74 | Temp 98.4°F | Resp 17 | Ht 66.0 in | Wt 175.4 lb

## 2021-05-22 DIAGNOSIS — D4709 Other mast cell neoplasms of uncertain behavior: Secondary | ICD-10-CM | POA: Insufficient documentation

## 2021-05-22 DIAGNOSIS — R0602 Shortness of breath: Secondary | ICD-10-CM | POA: Diagnosis not present

## 2021-05-22 DIAGNOSIS — J3089 Other allergic rhinitis: Secondary | ICD-10-CM

## 2021-05-22 MED ORDER — BUDESONIDE-FORMOTEROL FUMARATE 160-4.5 MCG/ACT IN AERO: 2.0000 | INHALATION_SPRAY | Freq: Two times a day (BID) | RESPIRATORY_TRACT | 12 refills | Status: DC

## 2021-05-22 NOTE — Progress Notes (Signed)
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 Mastocytosis - We are going to get a serum tryptase to see where this is at this point in time. - Consider starting Xolair for management of your mastocytosis. - Information on Xolair and urticaria provided. - Double up to the Allegra to TWICE daily.  2. SOB (shortness of breath) - Lung testing looked slightly low today but it got around 50% better with the albuterol treatment.  - Start Symbicort two puffs twice daily with a spacer.  - We are also working on getting the Xolair approved, which will help with your breathing and your rash.  3. Perennial allergic rhinitis (dust mites, cockroach) - Continue with Allegra but increase to twice daily.   4. Return in about 4 weeks (around 06/19/2021).     This note in its entirety was forwarded to the Provider who requested this consultation.  Subjective:   Joshua Haney is a 65 y.o. male presenting today for evaluation of  Chief Complaint  Patient presents with   Rash   Breathing Problem    [name] has a history of the following: Patient Active Problem List   Diagnosis Date Noted   Mastocytosis 05/22/2021   Perennial allergic rhinitis 05/22/2021   SOB (shortness of breath) 05/22/2021    History obtained from: chart review and patient and wife .  Joshua Haney was referred by Cyndi Bender, PA-C.    Joshua Haney is a 65 y.o. male presenting for an evaluation of shortness of breath .  He had a bone marrow and everything was negative. This was isolated to his skin only. He has been having issues with his breathing for the last several months. He went to see his Caridologist which was negative. He is  going to see Dr. Shearon Stalls soon.   Asthma/Respiratory Symptom History: He did have an albuterol inhaler when he has episodes of bronchitis. It is mostly in spring and fall. He has never seen Pulmonology but it is pending.  He has not been on a controller medication at all and his albuterol is likely out of date.   Allergic Rhinitis Symptom History: He has been on Allegra 161m once tablet daily. This controls his symptoms fairly well. He has chronic rhinitis.  He has not needed antibiotics in quite some time.    Eczema Symptom History: He has multiple red spots. These are not itchy. It goes away in one week. He also gets a sandpaper rash intermittently from time to time and he usually has to go to Urgent Care and he gets prednisone. He gets prednisone 1-2 times per year for a rash. He does not go "too often".   He was recently diagnosed with severe OSA. This was done in March 2022. A CPAP is still pending. Breathing continues to be a problem in the mornings during his exertion.   Otherwise, there is no history of other atopic diseases, including food allergies, drug allergies, stinging insect allergies, or contact dermatitis. There is no significant infectious history. Vaccinations are up to date.    Past Medical History: Patient Active Problem List   Diagnosis Date Noted   Mastocytosis 05/22/2021   Perennial allergic rhinitis 05/22/2021   SOB (shortness of breath) 05/22/2021  Medication List:  Allergies as of 05/22/2021   No Known Allergies      Medication List        Accurate as of May 22, 2021 11:59 PM. If you have any questions, ask your nurse or doctor.          Auvi-Q 0.3 mg/0.3 mL Soaj injection Generic drug: EPINEPHrine Use as directed for life threatening allergic reaction   EPINEPHrine 0.3 mg/0.3 mL Soaj injection Commonly known as: EPI-PEN Use as directed for life threatening allergic reaction   B-12 100 MCG Tabs Take by mouth daily.   budesonide-formoterol  160-4.5 MCG/ACT inhaler Commonly known as: Symbicort Inhale 2 puffs into the lungs in the morning and at bedtime. Started by: Valentina Shaggy, MD   fenofibrate 48 MG tablet Commonly known as: Tricor Take 1 tablet (48 mg total) by mouth daily.   fexofenadine 180 MG tablet Commonly known as: ALLEGRA Take 180 mg by mouth daily.   FIBER PO Take 500 mg by mouth.   Garlic 6269 MG Caps Take by mouth.   glucosamine-chondroitin 500-400 MG tablet Take 1 tablet by mouth daily.   MENS MULTIVITAMIN PLUS PO Take by mouth.   Omega-3 1000 MG Caps Take by mouth daily.   pantoprazole 40 MG tablet Commonly known as: PROTONIX TAKE 1 (ONE) TABLET BY MOUTH EVERY DAY FOR STOMACH   rosuvastatin 10 MG tablet Commonly known as: CRESTOR Take 10 mg by mouth daily.   Saw Palmetto 450 MG Caps Take by mouth daily.   SAW PALMETTO PO Take by mouth.   Shingrix injection Generic drug: Zoster Vaccine Adjuvanted Inject 0.5 mLs into the muscle once.   VITAMIN D PO Take 25 mcg by mouth.        Birth History: non-contributory  Developmental History: non-contributory  Past Surgical History: Past Surgical History:  Procedure Laterality Date   BONE MARROW BIOPSY  2018   NECK SURGERY  2005   VASECTOMY       Family History: Family History  Problem Relation Age of Onset   Cancer Mother    Hypertension Father    Cancer Sister    Cancer Brother    Diabetes Paternal Grandmother    CAD Brother    Allergic rhinitis Neg Hx    Asthma Neg Hx      Social History: Joshua Haney lives at home with his wife.  They live in a house that was built in 1988.  There is carpeting and wood throughout the home.  They have electric heating and central cooling.  There is a dog inside of the home and cats outside of the home.  There are no dust mite covers on the bedding.  There is no tobacco exposure.  He recently retired in April 2021.  He is not exposed to fumes, chemicals, or dust.  He does use a HEPA  filter in the home.   Review of Systems  Constitutional: Negative.  Negative for chills, fever, malaise/fatigue and weight loss.  HENT: Negative.  Negative for congestion, ear discharge and ear pain.   Eyes:  Negative for pain, discharge and redness.  Respiratory:  Positive for cough and shortness of breath. Negative for sputum production and wheezing.   Cardiovascular: Negative.  Negative for chest pain and palpitations.  Gastrointestinal:  Negative for abdominal pain, constipation, diarrhea, heartburn, nausea and vomiting.  Skin:  Positive for itching and rash.  Neurological:  Negative for dizziness and headaches.  Endo/Heme/Allergies:  Positive for environmental allergies. Does not bruise/bleed  easily.      Objective:   Blood pressure (!) 142/80, pulse 74, temperature 98.4 F (36.9 C), temperature source Temporal, resp. rate 17, height '5\' 6"'  (1.676 m), weight 175 lb 6.4 oz (79.6 kg), SpO2 99 %. Body mass index is 28.31 kg/m.   Physical Exam:   Physical Exam Constitutional:      Appearance: He is well-developed.  HENT:     Head: Normocephalic and atraumatic.     Right Ear: Tympanic membrane, ear canal and external ear normal.     Left Ear: Tympanic membrane, ear canal and external ear normal.     Nose: Mucosal edema present. No nasal deformity, septal deviation or rhinorrhea.     Right Turbinates: Enlarged and swollen.     Left Turbinates: Enlarged and swollen.     Right Sinus: No maxillary sinus tenderness or frontal sinus tenderness.     Left Sinus: No maxillary sinus tenderness or frontal sinus tenderness.     Mouth/Throat:     Mouth: Mucous membranes are not pale and not dry.     Pharynx: Uvula midline.  Eyes:     General: Lids are normal. No allergic shiner.       Right eye: No discharge.        Left eye: No discharge.     Conjunctiva/sclera: Conjunctivae normal.     Right eye: Right conjunctiva is not injected. No chemosis.    Left eye: Left conjunctiva is not  injected. No chemosis.    Pupils: Pupils are equal, round, and reactive to light.  Cardiovascular:     Rate and Rhythm: Normal rate and regular rhythm.     Heart sounds: Normal heart sounds.  Pulmonary:     Effort: Pulmonary effort is normal. No tachypnea, accessory muscle usage or respiratory distress.     Breath sounds: Normal breath sounds. No wheezing, rhonchi or rales.     Comments: Somewhat decreased air movement at the bases.  Chest:     Chest wall: No tenderness.  Lymphadenopathy:     Cervical: No cervical adenopathy.  Skin:    General: Skin is warm.     Capillary Refill: Capillary refill takes less than 2 seconds.     Coloration: Skin is not pale.     Findings: No abrasion, erythema, petechiae or rash. Rash is not papular, urticarial or vesicular.     Comments: He does have some hyperpigmented lesions on the bilateral arms. There is no mastocytoma appreciated.   Neurological:     Mental Status: He is alert.  Psychiatric:        Behavior: Behavior is cooperative.     Diagnostic studies:    Spirometry: results abnormal (FEV1: 1.60L, FVC:2.00L, FEV1/FVC: 80%).    Spirometry consistent with possible restrictive disease. Albuterol four puffs via MDI treatment given in clinic with significant improvement in FEV1 and FVC per ATS criteria.  Allergy Studies: labs sent instead          Salvatore Marvel, MD [facility]

## 2021-05-22 NOTE — Patient Instructions (Addendum)
1. Mastocytosis - We are going to get a serum tryptase to see where this is at this point in time. - Consider starting Xolair for management of your mastocytosis. - Information on Xolair and urticaria provided. - Double up to the Allegra to TWICE daily.  2. SOB (shortness of breath) - Lung testing looked slightly low today but it got around 50% better with the albuterol treatment.  - Start Symbicort two puffs twice daily with a spacer.  - We are also working on getting the Xolair approved, which will help with your breathing and your rash.  3. Perennial allergic rhinitis (dust mites, cockroach) - Continue with Allegra but increase to twice daily.   4. Return in about 4 weeks (around 06/19/2021).    Please inform us of any Emergency Department visits, hospitalizations, or changes in symptoms. Call us before going to the ED for breathing or allergy symptoms since we might be able to fit you in for a sick visit. Feel free to contact us anytime with any questions, problems, or concerns.  It was a pleasure to meet you and your family today!  Websites that have reliable patient information: 1. American Academy of Asthma, Allergy, and Immunology: www.aaaai.org 2. Food Allergy Research and Education (FARE): foodallergy.org 3. Mothers of Asthmatics: http://www.asthmacommunitynetwork.org 4. American College of Allergy, Asthma, and Immunology: www.acaai.org   COVID-19 Vaccine Information can be found at: ShippingScam.co.uk For questions related to vaccine distribution or appointments, please email [email or web address] or call [phone].   We realize that you might be concerned about having an allergic reaction to the COVID19 vaccines. To help with that concern, WE ARE OFFERING THE COVID19 VACCINES IN OUR OFFICE! Ask the front desk for dates!     "Like" [place] on Facebook and Instagram for our latest updates!      A healthy  democracy works best when [employer] participate! Make sure you are registered to vote! If you have moved or changed any of your contact information, you will need to get this updated before voting!  In some cases, you MAY be able to register to vote online: [email or web address]    4

## 2021-05-23 ENCOUNTER — Encounter: Payer: Self-pay | Admitting: Allergy & Immunology

## 2021-05-23 ENCOUNTER — Telehealth: Payer: Self-pay | Admitting: Allergy & Immunology

## 2021-05-23 MED ORDER — ALBUTEROL SULFATE HFA 108 (90 BASE) MCG/ACT IN AERS: 2.0000 | INHALATION_SPRAY | RESPIRATORY_TRACT | 3 refills | Status: DC | PRN

## 2021-05-23 NOTE — Telephone Encounter (Signed)
Lm for pt stating I was sorry that rx was not sent in and sent in the rx to [facility] pharmacy

## 2021-05-23 NOTE — Telephone Encounter (Signed)
Patient was seen yesterday, 05/22/21. Wife called today, 05/23/21 and said she went to pick up the medication and his Symbicort was at the pharmacy, but he was also supposed to have an albuterol inhaler sent too, and they had not prescription for that. [facility] [place]. Please call wife when it is sent in.

## 2021-05-24 LAB — TRYPTASE: Tryptase: 3.8 ug/L (ref 2.2–13.2)

## 2021-05-26 LAB — IGE: IgE (Immunoglobulin E), Serum: 67 IU/mL (ref 6–495)

## 2021-05-29 ENCOUNTER — Telehealth: Payer: Self-pay | Admitting: *Deleted

## 2021-05-29 NOTE — Telephone Encounter (Signed)
Called and spoke to wife about patient starting Xolair and getting same thru patient assistance. Will mail consent to patient to return to me.  Per his Ige/wgt his dose would be 225mg  every 14 days. Is that the dose you want him to take or other?

## 2021-05-29 NOTE — Telephone Encounter (Signed)
-----   Message from Valentina Shaggy, MD sent at 05/23/2021 11:31 PM EDT ----- Arvid Right for mastocytosis? I am getting an IgE to see if we can get it approved for asthma. He has a rash, but not really urticaria from what I can tell.   Routing to [place] since he is going to follow up in [place] with her. He was originally seen by [place] back in 2018 or so.

## 2021-05-30 NOTE — Telephone Encounter (Signed)
Sure let's go with that dosing. Thanks!   Salvatore Marvel, MD Allergy and [facility]

## 2021-06-07 ENCOUNTER — Other Ambulatory Visit: Payer: Self-pay

## 2021-06-07 ENCOUNTER — Ambulatory Visit: Payer: Medicare Other | Admitting: Internal Medicine

## 2021-06-07 ENCOUNTER — Encounter: Payer: Self-pay | Admitting: Internal Medicine

## 2021-06-07 VITALS — BP 120/80 | HR 83 | Temp 98.0°F | Ht 66.0 in | Wt 172.4 lb

## 2021-06-07 DIAGNOSIS — J301 Allergic rhinitis due to pollen: Secondary | ICD-10-CM

## 2021-06-07 DIAGNOSIS — R0602 Shortness of breath: Secondary | ICD-10-CM

## 2021-06-07 DIAGNOSIS — D4709 Other mast cell neoplasms of uncertain behavior: Secondary | ICD-10-CM | POA: Diagnosis not present

## 2021-06-07 DIAGNOSIS — G4733 Obstructive sleep apnea (adult) (pediatric): Secondary | ICD-10-CM

## 2021-06-07 LAB — NITRIC OXIDE: Nitric Oxide: 13

## 2021-06-07 MED ORDER — FLUTICASONE PROPIONATE 50 MCG/ACT NA SUSP: 1.0000 | Freq: Every day | NASAL | 5 refills | Status: DC

## 2021-06-07 NOTE — Patient Instructions (Addendum)
Please schedule follow up scheduled with myself in 3 months.  If my schedule is not open yet, we will contact you with a reminder closer to that time.  Before your next visit I would like you to have: Full set of PFTs Will prescribe overnight oximetry to see if you need oxygen at night until you get your CPAP.   Keep taking symbicort. Start taking flonase. Keep taking allegra twice a day.   Flonase - 1 spray on each side of your nose twice a day for first week, then 1 spray on each side.   Instructions for use: If you also use a saline nasal spray or rinse, use that first. Position the head with the chin slightly tucked. Use the right hand to spray into the left nostril and the right hand to spray into the left nostril.   Point the bottle away from the septum of your nose (cartilage that divides the two sides of your nose).  Hold the nostril closed on the opposite side from where you will spray Spray once and gently sniff to pull the medicine into the higher parts of your nose.  Don't sniff too hard as the medicine will drain down the back of your throat instead. Repeat with a second spray on the same side if prescribed. Repeat on the other side of your nose.

## 2021-06-07 NOTE — Progress Notes (Signed)
Joshua Haney    [id number]    11-27-1955  Primary Care Physician:Conroy, Tamala Julian  Referring Physician: Sueanne Margarita, MD [phone] [address] [employer],  [place] [address] Reason for Consultation: "dyspnea on exertion" Date of Consultation: 06/07/2021  Chief complaint:   Chief Complaint  Patient presents with   Consult    Pt is being referred by Dr. Radford Pax due to [place].  Pt states he has had complaints of SOB for about 6 months which he said can happen with activities but it is worse in the mornings due to having OSA and waiting for a CPAP machine.     HPI: Joshua Haney is a 65 y.o. man who presents for new patient evaluation of dyspnea on exertion. Symptoms started about a year ago, worse in the morning and gets better as the day goes on. Worse with exertion and with the shower. No cough, no chest tightness of wheezing. Frequent sneezing with bad rhinitis.   Has been on allegra for several years. Allegra increased to twice a day. Not much benefit.Has mastocytosis previously with high tryptase, full body rashes requiring IV steroids. No full body rashes in the past year, but just gets a few spots.   Had some bronchitis as a child. Gets frequent sinusitis. Gets frequent IV steroids at urgent care for severe allergic symptoms. This helps significantly.   Recently diagnosed with OSA, AHI>70 waiting on CPAP.   Has seen cardiology for dyspnea evaluation, and is being risk stratified for CAD.   Started symbicort two weeks ago with allergist. Has not used albuterol at all since starting symbicort, but wife says he probably wouldn't use it unless he was in extremis. Not sure how much symbicort is helping yet.   Current Regimen: symbicort, prn albuterol Asthma Triggers: Exacerbations in the last year: History of hospitalization or intubation: Allergy Testing: yes,  with post nasal drainage on allegra twice a day.  GERD: denies Allergic Rhinitis: yes ACT:  Asthma  Control Test ACT Total Score  06/07/2021 18   FeNO: obtained today 13 ppb   Social history:  Occupation: Retired, worked in Systems developer. Exposures: grew up in [place], still lives there. 2 cats(outside) and a dog.  Smoking history: never smoker, no passive smoke exposure  Social History   Occupational History   Not on file  Tobacco Use   Smoking status: Never    Passive exposure: Past   Smokeless tobacco: Never  Vaping Use   Vaping Use: Never used  Substance and Sexual Activity   Alcohol use: Never   Drug use: Never   Sexual activity: Not on file    Relevant family history:  Family History  Problem Relation Age of Onset   Cancer Mother    Hypertension Father    Cancer Sister    Cancer Brother    Diabetes Paternal Grandmother    CAD Brother    Allergic rhinitis Neg Hx    Asthma Neg Hx     Past Medical History:  Diagnosis Date   Anxiety    Body mass index (BMI) of 19.0 to 19.9 in adult    BPH (benign prostatic hyperplasia)    Depression    Duodenal ulcer    Elevated PSA    Gastric ulcer    GERD (gastroesophageal reflux disease)    Hemorrhoids    Hives    Hypercholesteremia    Mastocytosis    Rash  Stomach ulcer    Varicose vein of leg     Past Surgical History:  Procedure Laterality Date   BONE MARROW BIOPSY  2018   NECK SURGERY  2005   VASECTOMY      Physical Exam: Blood pressure 120/80, pulse 83, temperature 98 F (36.7 C), temperature source Oral, height '5\' 6"'  (1.676 m), weight 172 lb 6.4 oz (78.2 kg), SpO2 98 %. Gen:      No acute distress ENT:  no nasal polyps, mucus membranes moist, right nare more patent than the left.  Lungs:    No increased respiratory effort, symmetric chest wall excursion, clear to auscultation bilaterally, no wheezes or crackles CV:         Regular rate and rhythm; no murmurs, rubs, or gallops.  No pedal edema Abd:      + bowel sounds; soft, non-tender; no distension MSK: no acute  synovitis of DIP or PIP joints, no mechanics hands.  Skin:      Warm and dry; no rashes Neuro: normal speech, no focal facial asymmetry Psych: alert and oriented x3, normal mood and affect   Data Reviewed/Medical Decision Making:  Independent interpretation of tests: Imaging:  Review of patient's CT cardiac scoring June 2022 lung window images revealed no pulmonary process. The patient's images have been independently reviewed by me.    PFTs: I have personally reviewed the patient's PFTs and in office spirometry done June 2022 showed no airflow limitation but a positive response to bronchodilator.  No flowsheet data found.  Labs: serum tryptase 3.8 (not elevated) June 2022 IgE elevated 67 IU/ml TSH wnl BNP wnl  Immunization status:  Immunization History  Administered Date(s) Administered   PFIZER(Purple Top)SARS-COV-2 Vaccination 01/08/2020, 02/02/2020, 08/30/2020     I reviewed prior external note(s) from Cardiology, PCP, Allergy  I reviewed the result(s) of the labs and imaging as noted above.   I have ordered exhaled NO   Assessment:  Dyspnea on Exertion, multifactorial from below conditions.  History of Mastocytosis Allergic Rhinitis Possible Asthma OSA, CPAP pending  Plan/Recommendations: Mr. Figiel has symptoms of dyspnea on exertion which could be related to asthma. He has elevated IgE as well as chronic allergic rhinitis.  Symptoms could be related to asthma. Would continue symbicort with prn albuterol for now, as it has only been a couple weeks and he seems to have some benefit.  A/I discussing starting Xolair for mastocytosis.  Exhaled NO today was 13 ppb.  Regarding his rhinitis, continue allegra BID. Start flonase BID as well since he does respond to steroids previously.  Can consider changing OTC anti-histamine to cetirizine if no ongoing improvement.  It may be a long wait until we can get his CPAP due to back logs. Will obtain ONO so he can get nocturnal O2  in the mean time.  Will get a full set of PFTs at next visit.   Return to Care: Return in about 3 months (around 09/07/2021).  Lenice Llamas, MD Pulmonary and [place]  CC: Sueanne Margarita, MD

## 2021-06-10 DIAGNOSIS — G4733 Obstructive sleep apnea (adult) (pediatric): Secondary | ICD-10-CM | POA: Diagnosis not present

## 2021-06-10 DIAGNOSIS — R0602 Shortness of breath: Secondary | ICD-10-CM | POA: Diagnosis not present

## 2021-06-11 DIAGNOSIS — G4733 Obstructive sleep apnea (adult) (pediatric): Secondary | ICD-10-CM | POA: Diagnosis not present

## 2021-06-11 DIAGNOSIS — R0602 Shortness of breath: Secondary | ICD-10-CM | POA: Diagnosis not present

## 2021-06-11 NOTE — Telephone Encounter (Signed)
Received consent form and submitted to PAP

## 2021-06-19 ENCOUNTER — Telehealth: Payer: Self-pay | Admitting: *Deleted

## 2021-06-19 NOTE — Telephone Encounter (Signed)
Called and advised wife patient approved for free XOlair through PAP and will call as soon as I can order same with delivery date so he can start therapy

## 2021-06-20 ENCOUNTER — Other Ambulatory Visit: Payer: Self-pay | Admitting: *Deleted

## 2021-06-20 ENCOUNTER — Encounter: Payer: Self-pay | Admitting: Allergy & Immunology

## 2021-06-20 MED ORDER — FEXOFENADINE HCL 180 MG PO TABS: 180.0000 mg | ORAL_TABLET | Freq: Two times a day (BID) | ORAL | 1 refills | Status: DC

## 2021-06-23 DIAGNOSIS — G4733 Obstructive sleep apnea (adult) (pediatric): Secondary | ICD-10-CM | POA: Diagnosis not present

## 2021-06-25 DIAGNOSIS — R0602 Shortness of breath: Secondary | ICD-10-CM | POA: Diagnosis not present

## 2021-07-03 ENCOUNTER — Encounter: Payer: Self-pay | Admitting: Allergy & Immunology

## 2021-07-03 ENCOUNTER — Ambulatory Visit: Payer: Medicare Other | Admitting: Allergy & Immunology

## 2021-07-03 ENCOUNTER — Ambulatory Visit: Payer: Medicare Other

## 2021-07-03 ENCOUNTER — Other Ambulatory Visit: Payer: Self-pay

## 2021-07-03 VITALS — BP 132/82 | HR 92 | Temp 97.9°F | Ht 66.0 in | Wt 176.6 lb

## 2021-07-03 DIAGNOSIS — D4709 Other mast cell neoplasms of uncertain behavior: Secondary | ICD-10-CM | POA: Diagnosis not present

## 2021-07-03 DIAGNOSIS — J4540 Moderate persistent asthma, uncomplicated: Secondary | ICD-10-CM | POA: Diagnosis not present

## 2021-07-03 DIAGNOSIS — J3089 Other allergic rhinitis: Secondary | ICD-10-CM

## 2021-07-03 DIAGNOSIS — G4733 Obstructive sleep apnea (adult) (pediatric): Secondary | ICD-10-CM

## 2021-07-03 DIAGNOSIS — T7840XD Allergy, unspecified, subsequent encounter: Secondary | ICD-10-CM

## 2021-07-03 MED ORDER — ALBUTEROL SULFATE HFA 108 (90 BASE) MCG/ACT IN AERS: 2.0000 | INHALATION_SPRAY | RESPIRATORY_TRACT | 2 refills | Status: DC | PRN

## 2021-07-03 MED ORDER — FLUTICASONE PROPIONATE 50 MCG/ACT NA SUSP: 1.0000 | Freq: Every day | NASAL | 5 refills | Status: DC

## 2021-07-03 MED ORDER — FEXOFENADINE HCL 180 MG PO TABS: 180.0000 mg | ORAL_TABLET | Freq: Every day | ORAL | 5 refills | Status: DC

## 2021-07-03 MED ORDER — OMALIZUMAB 150 MG/ML ~~LOC~~ SOSY
225.0000 mg | PREFILLED_SYRINGE | SUBCUTANEOUS | Status: DC
  Administered 2021-07-03: 225 mg via SUBCUTANEOUS

## 2021-07-03 MED ORDER — BUDESONIDE-FORMOTEROL FUMARATE 160-4.5 MCG/ACT IN AERO: 2.0000 | INHALATION_SPRAY | Freq: Two times a day (BID) | RESPIRATORY_TRACT | 5 refills | Status: DC

## 2021-07-03 MED ORDER — EPINEPHRINE 0.3 MG/0.3ML IJ SOAJ: 0.3000 mg | Freq: Once | INTRAMUSCULAR | 2 refills | Status: AC

## 2021-07-03 NOTE — Addendum Note (Signed)
Addended by: Clovis Cao A on: 07/03/2021 06:52 PM   Modules accepted: Orders

## 2021-07-03 NOTE — Progress Notes (Signed)
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 Shearon Stalls)   Perennial allergic rhinitis (dust mites, cockroach)    Plan/Recommendations:    1. Mastocytosis - Xolair given today in the clinic. - Come back in four weeks for the next injection. - I am confused by the normal tryptase, but I suppose this is a good thing.  - Continue with Allegra twice daily.   2. SOB (shortness of breath) - Lung testing not done today. - Continue with the Symbicort two puffs twice daily. - Continue with albuterol as needed.   3. Perennial allergic rhinitis (dust mites, cockroach) - Continue with Allegra but increase to twice daily.   4. Obstructive sleep apnea - Call Dr. Shearon Stalls about your CPAP. - She might be able to make changes over the phone.   5. Return in about 6 months (around 01/03/2022).    Subjective:   Joshua Haney is a 65 y.o. male presenting today for follow up of  Chief Complaint  Patient presents with   Other    Xolair restart - finally got a CPAP machine     Joshua Haney has a history of the following: Patient Active Problem List   Diagnosis Date Noted   Mastocytosis 05/22/2021   Perennial allergic rhinitis 05/22/2021   SOB (shortness of breath) 05/22/2021    History obtained from: chart review and patient and his wife .  Joshua Haney is a 65 y.o. male presenting for a follow up visit.  He was last seen in June 2022 to reestablish care.  Prior to this, he had been followed by Dr. Nelva Bush.  He has a history of mastocytosis with a normal oncologic work-up, including a bone marrow biopsy.  He was having increasing attacks, which brought him back to our practice.  In the past, his tryptase have ranged from 30-40.  However, his  tryptase was 3.8 and we checked it at the end of June.  Regardless, we decided to start Xolair.  This has been approved and he presents today for his first injection.  He is currently on the Allegra twice daily. He has had no more attacks since we saw him.  He has done very well on the regimen.  Asthma/Respiratory Symptom History: He is on the Symbicort two puffs twice daily. He feels that the Symbicort addition has helped him to breathe deeper.  She has a rescue inhaler that she is using as needed. He had a sleep study ordered by his PCP that demonstrated obstructive sleep apnea.  He did get a CPAP machine delivered to home in the mail but had some questions about it.  He called the company to ask questions, who deferred to the ordering provider.  However, Divine and his wife do not think that his primary care provider know anything about the CPAP.  They are wondering if I can answer their questions about the CPAP.  The snoring is better.  He uses a nasal pillow and not a mask that covers his entire face.  They have not talked to Dr. Shearon Stalls about the CPAP since she did not order it, but they are open to giving her office a call. His "sleep score" has improved markedly on the CPAP machine.  Her next appointment with Dr. Shearon Stalls is  in October 2022.  Allergic Rhinitis Symptom History: He is on Flonase once daily.  He is also using the Allegra twice daily.  This regimen seems to be working well for him.  He has not needed antibiotics and has not had a sinus infection in quite some time.  Otherwise, there have been no changes to his past medical history, surgical history, family history, or social history.    Review of Systems  Constitutional: Negative.  Negative for fever, malaise/fatigue and weight loss.  HENT: Negative.  Negative for congestion, ear discharge and ear pain.   Eyes:  Negative for pain, discharge and redness.  Respiratory:  Negative for cough, sputum production, shortness of breath and  wheezing.        Positive for obstructive sleep apnea.  Cardiovascular: Negative.  Negative for chest pain and palpitations.  Gastrointestinal:  Negative for abdominal pain, heartburn, nausea and vomiting.  Skin: Negative.  Negative for itching and rash.  Neurological:  Negative for dizziness and headaches.  Endo/Heme/Allergies:  Negative for environmental allergies. Does not bruise/bleed easily.      Objective:   Blood pressure 132/82, pulse 92, temperature 97.9 F (36.6 C), height '5\' 6"'  (1.676 m), weight 176 lb 9.6 oz (80.1 kg), SpO2 95 %. Body mass index is 28.5 kg/m.   Physical Exam:  Physical Exam Vitals reviewed.  Constitutional:      Appearance: He is well-developed.     Comments: Very pleasant male.  Hard of hearing.  HENT:     Head: Normocephalic and atraumatic.     Right Ear: Tympanic membrane, ear canal and external ear normal.     Left Ear: Tympanic membrane, ear canal and external ear normal.     Nose: No nasal deformity, septal deviation, mucosal edema or rhinorrhea.     Right Turbinates: Enlarged and swollen.     Left Turbinates: Enlarged and swollen.     Right Sinus: No maxillary sinus tenderness or frontal sinus tenderness.     Left Sinus: No maxillary sinus tenderness or frontal sinus tenderness.     Mouth/Throat:     Mouth: Mucous membranes are not pale and not dry.     Pharynx: Uvula midline.  Eyes:     General: Lids are normal. No allergic shiner.       Right eye: No discharge.        Left eye: No discharge.     Conjunctiva/sclera: Conjunctivae normal.     Right eye: Right conjunctiva is not injected. No chemosis.    Left eye: Left conjunctiva is not injected. No chemosis.    Pupils: Pupils are equal, round, and reactive to light.  Cardiovascular:     Rate and Rhythm: Normal rate and regular rhythm.     Heart sounds: Normal heart sounds.  Pulmonary:     Effort: Pulmonary effort is normal. No tachypnea, accessory muscle usage or respiratory  distress.     Breath sounds: Normal breath sounds. No wheezing, rhonchi or rales.     Comments: Moving air well in all lung fields.  No increased work of breathing. Chest:     Chest wall: No tenderness.  Lymphadenopathy:     Cervical: No cervical adenopathy.  Skin:    General: Skin is warm.     Capillary Refill: Capillary refill takes less than 2 seconds.     Coloration: Skin is not pale.     Findings: No abrasion, erythema, petechiae or rash. Rash is not papular, urticarial or vesicular.  Comments: No eczematous or urticarial lesions noted.  Neurological:     Mental Status: He is alert.  Psychiatric:        Behavior: Behavior is cooperative.     Diagnostic studies:    Spirometry: results normal (FEV1: 3.05/104%, FVC: 3.35/85%, FEV1/FVC: 91%).    Spirometry consistent with normal pattern.   Allergy Studies: none        Salvatore Marvel, MD  Allergy and [facility]

## 2021-07-03 NOTE — Patient Instructions (Addendum)
1. Mastocytosis - Xolair given today in the clinic. - Come back in four weeks for the next injection. - I am confused by the normal tryptase, but I suppose this is a good thing.  - Continue with Allegra twice daily.   2. SOB (shortness of breath) - Lung testing not done today. - Continue with the Symbicort two puffs twice daily. - Continue with albuterol as needed.   3. Perennial allergic rhinitis (dust mites, cockroach) - Continue with Allegra but increase to twice daily.   4. Obstructive sleep apnea - Call Dr. Shearon Stalls about your CPAP. - She might be able to make changes over the phone.   5. Return in about 6 months (around 01/03/2022).    Please inform us of any Emergency Department visits, hospitalizations, or changes in symptoms. Call us before going to the ED for breathing or allergy symptoms since we might be able to fit you in for a sick visit. Feel free to contact us anytime with any questions, problems, or concerns.  It was a pleasure to see you and your family again today!  Websites that have reliable patient information: 1. American Academy of Asthma, Allergy, and Immunology: www.aaaai.org 2. Food Allergy Research and Education (FARE): foodallergy.org 3. Mothers of Asthmatics: http://www.asthmacommunitynetwork.org 4. American College of Allergy, Asthma, and Immunology: www.acaai.org   COVID-19 Vaccine Information can be found at: ShippingScam.co.uk For questions related to vaccine distribution or appointments, please email [email or web address] or call [phone].   We realize that you might be concerned about having an allergic reaction to the COVID19 vaccines. To help with that concern, WE ARE OFFERING THE COVID19 VACCINES IN OUR OFFICE! Ask the front desk for dates!     "Like" [place] on Facebook and Instagram for our latest updates!      A healthy democracy works best when [employer] participate! Make sure  you are registered to vote! If you have moved or changed any of your contact information, you will need to get this updated before voting!  In some cases, you MAY be able to register to vote online: [email or web address]    4

## 2021-07-04 NOTE — Telephone Encounter (Signed)
ND please advise. Thanks   We were wondering about the oxygen test for Joshua Haney although he will not be able to wear oxygen at night now because his cpap finally arrived after waiting months. Would Dr Shearon Stalls be the doctor we could refer to for his cpap readings and adjustments? The PA at his GP that ordered the initial sleep study has no familiarity with a cpap. I asked the cpap company when they called a couple of questions about his readings and numbers it's preset to and they said refer to Dr. Harley [place] don't know who to ask. Thanks

## 2021-07-06 ENCOUNTER — Telehealth: Payer: Self-pay | Admitting: Allergy & Immunology

## 2021-07-06 MED ORDER — FEXOFENADINE HCL 180 MG PO TABS: 180.0000 mg | ORAL_TABLET | Freq: Two times a day (BID) | ORAL | 2 refills | Status: DC

## 2021-07-06 MED ORDER — FEXOFENADINE HCL 180 MG PO TABS
180.0000 mg | ORAL_TABLET | Freq: Two times a day (BID) | ORAL | 5 refills | Status: DC
Start: 2021-07-06 — End: 2021-07-06

## 2021-07-06 NOTE — Addendum Note (Signed)
Addended by: Clovis Cao A on: 07/06/2021 09:56 AM   Modules accepted: Orders

## 2021-07-06 NOTE — Telephone Encounter (Signed)
Patient's wife called requesting patient's allegra prescription be fixed. Wife stated patient is using Allegra twice a day instead of once a day. When they went to pick up medication it stated use allegra once a day.   [facility] [address] [place] [address]  Best contact number: [phone]

## 2021-07-17 ENCOUNTER — Ambulatory Visit: Payer: Medicare Other

## 2021-07-24 DIAGNOSIS — G4733 Obstructive sleep apnea (adult) (pediatric): Secondary | ICD-10-CM | POA: Diagnosis not present

## 2021-08-06 ENCOUNTER — Other Ambulatory Visit: Payer: Self-pay

## 2021-08-06 ENCOUNTER — Ambulatory Visit (INDEPENDENT_AMBULATORY_CARE_PROVIDER_SITE_OTHER): Payer: Medicare Other | Admitting: *Deleted

## 2021-08-06 DIAGNOSIS — J4540 Moderate persistent asthma, uncomplicated: Secondary | ICD-10-CM | POA: Diagnosis not present

## 2021-08-06 MED ORDER — OMALIZUMAB 150 MG/ML ~~LOC~~ SOSY
300.0000 mg | PREFILLED_SYRINGE | SUBCUTANEOUS | Status: AC
  Administered 2021-08-06 – 2024-12-13 (×44): 300 mg via SUBCUTANEOUS

## 2021-08-20 ENCOUNTER — Ambulatory Visit: Payer: Medicare Other

## 2021-08-21 ENCOUNTER — Other Ambulatory Visit: Payer: Medicare Other

## 2021-08-24 DIAGNOSIS — G4733 Obstructive sleep apnea (adult) (pediatric): Secondary | ICD-10-CM | POA: Diagnosis not present

## 2021-08-28 ENCOUNTER — Other Ambulatory Visit: Payer: Self-pay

## 2021-08-28 ENCOUNTER — Other Ambulatory Visit: Payer: Medicare Other | Admitting: *Deleted

## 2021-08-28 DIAGNOSIS — Z79899 Other long term (current) drug therapy: Secondary | ICD-10-CM

## 2021-08-28 LAB — LIPID PANEL
Chol/HDL Ratio: 3.8 ratio (ref 0.0–5.0)
Cholesterol, Total: 135 mg/dL (ref 100–199)
HDL: 36 mg/dL — ABNORMAL LOW (ref >39)
LDL Chol Calc (NIH): 79 mg/dL (ref 0–99)
Triglycerides: 111 mg/dL (ref 0–149)
VLDL Cholesterol Cal: 20 mg/dL (ref 5–40)

## 2021-08-28 LAB — LDL CHOLESTEROL, DIRECT: LDL Direct: 82 mg/dL (ref 0–99)

## 2021-08-30 ENCOUNTER — Telehealth: Payer: Self-pay | Admitting: Cardiology

## 2021-08-30 DIAGNOSIS — Z79899 Other long term (current) drug therapy: Secondary | ICD-10-CM

## 2021-08-30 MED ORDER — ROSUVASTATIN CALCIUM 20 MG PO TABS: 20.0000 mg | ORAL_TABLET | Freq: Every day | ORAL | 3 refills | Status: DC

## 2021-08-30 NOTE — Telephone Encounter (Signed)
Pts wife on DPR advised his Lipid results and verbalized understanding and will return 10/11/21 for fasting repeat labs.

## 2021-08-30 NOTE — Telephone Encounter (Signed)
   Pt's wife is returning call to get lab result

## 2021-09-03 ENCOUNTER — Ambulatory Visit (INDEPENDENT_AMBULATORY_CARE_PROVIDER_SITE_OTHER): Payer: Medicare Other

## 2021-09-03 DIAGNOSIS — J4540 Moderate persistent asthma, uncomplicated: Secondary | ICD-10-CM

## 2021-09-07 ENCOUNTER — Ambulatory Visit: Payer: Medicare Other | Admitting: Internal Medicine

## 2021-09-07 ENCOUNTER — Encounter: Payer: Self-pay | Admitting: Internal Medicine

## 2021-09-07 ENCOUNTER — Ambulatory Visit (INDEPENDENT_AMBULATORY_CARE_PROVIDER_SITE_OTHER): Payer: Medicare Other | Admitting: Internal Medicine

## 2021-09-07 ENCOUNTER — Other Ambulatory Visit: Payer: Self-pay

## 2021-09-07 ENCOUNTER — Telehealth: Payer: Self-pay

## 2021-09-07 VITALS — BP 130/74 | HR 75 | Temp 97.7°F | Ht 66.0 in | Wt 181.2 lb

## 2021-09-07 DIAGNOSIS — G4733 Obstructive sleep apnea (adult) (pediatric): Secondary | ICD-10-CM

## 2021-09-07 DIAGNOSIS — R0602 Shortness of breath: Secondary | ICD-10-CM

## 2021-09-07 DIAGNOSIS — J4540 Moderate persistent asthma, uncomplicated: Secondary | ICD-10-CM | POA: Diagnosis not present

## 2021-09-07 DIAGNOSIS — J301 Allergic rhinitis due to pollen: Secondary | ICD-10-CM

## 2021-09-07 DIAGNOSIS — Z9989 Dependence on other enabling machines and devices: Secondary | ICD-10-CM

## 2021-09-07 DIAGNOSIS — Z23 Encounter for immunization: Secondary | ICD-10-CM

## 2021-09-07 DIAGNOSIS — D4709 Other mast cell neoplasms of uncertain behavior: Secondary | ICD-10-CM | POA: Diagnosis not present

## 2021-09-07 LAB — PULMONARY FUNCTION TEST
DL/VA % pred: 102 %
DL/VA: 4.29 ml/min/mmHg/L
DLCO cor % pred: 92 %
DLCO cor: 21.45 ml/min/mmHg
DLCO unc % pred: 92 %
DLCO unc: 21.45 ml/min/mmHg
FEF 25-75 Post: 2.89 L/sec
FEF 25-75 Pre: 2.92 L/sec
FEF2575-%Change-Post: -1 %
FEF2575-%Pred-Post: 127 %
FEF2575-%Pred-Pre: 129 %
FEV1-%Change-Post: -1 %
FEV1-%Pred-Post: 99 %
FEV1-%Pred-Pre: 100 %
FEV1-Post: 2.85 L
FEV1-Pre: 2.9 L
FEV1FVC-%Change-Post: 0 %
FEV1FVC-%Pred-Pre: 110 %
FEV6-%Change-Post: 0 %
FEV6-%Pred-Post: 95 %
FEV6-%Pred-Pre: 96 %
FEV6-Post: 3.5 L
FEV6-Pre: 3.53 L
FEV6FVC-%Change-Post: 0 %
FEV6FVC-%Pred-Post: 106 %
FEV6FVC-%Pred-Pre: 105 %
FVC-%Change-Post: -1 %
FVC-%Pred-Post: 90 %
FVC-%Pred-Pre: 91 %
FVC-Post: 3.5 L
FVC-Pre: 3.54 L
Post FEV1/FVC ratio: 81 %
Post FEV6/FVC ratio: 100 %
Pre FEV1/FVC ratio: 82 %
Pre FEV6/FVC Ratio: 100 %
RV % pred: 79 %
RV: 1.72 L
TLC % pred: 85 %
TLC: 5.3 L

## 2021-09-07 MED ORDER — AZELASTINE HCL 0.1 % NA SOLN: 1.0000 | Freq: Every day | NASAL | 3 refills | Status: DC

## 2021-09-07 NOTE — Progress Notes (Signed)
Joshua Haney    [id number]    10/11/1956  Primary Care Physician:Conroy, Tamala Julian Date of Appointment: 09/07/2021 Established Patient Visit  Chief complaint:   Chief Complaint  Patient presents with   Follow-up    PFT results     HPI: Joshua Haney is a 65 y.o. man with dyspnea and OSA.   Interval Updates: Has received his CPAP and is feeling much better. Sleeping amazingly well AHI<7/night compared to >70.   Rhinitis is improved with flonase. But still has some allergy symptoms.  Symbicort twice daily. Has not needed albuterol rescue.   Started xolair shots with Dr. Ernst Bowler for [place]. Doing well with this. Has had three so far.   I have reviewed his sleep download and he has 100% adherence with excellent suppression of his AHI. Mean pressure is 10 cm H20.   I have reviewed the patient's family social and past medical history and updated as appropriate.   Past Medical History:  Diagnosis Date   Anxiety    Body mass index (BMI) of 19.0 to 19.9 in adult    BPH (benign prostatic hyperplasia)    Depression    Duodenal ulcer    Elevated PSA    Gastric ulcer    GERD (gastroesophageal reflux disease)    Hemorrhoids    Hives    Hypercholesteremia    Mastocytosis    Rash    Stomach ulcer    Varicose vein of leg     Past Surgical History:  Procedure Laterality Date   BONE MARROW BIOPSY  2018   NECK SURGERY  2005   VASECTOMY      Family History  Problem Relation Age of Onset   Cancer Mother    Hypertension Father    Cancer Sister    Cancer Brother    Diabetes Paternal Grandmother    CAD Brother    Allergic rhinitis Neg Hx    Asthma Neg Hx     Social History   Occupational History   Not on file  Tobacco Use   Smoking status: Never    Passive exposure: Past   Smokeless tobacco: Never  Vaping Use   Vaping Use: Never used  Substance and Sexual Activity   Alcohol use: Never   Drug use: Never   Sexual activity: Not on file      Physical Exam: Blood pressure 130/74, pulse 75, temperature 97.7 F (36.5 C), temperature source Oral, height '5\' 6"'  (1.676 m), weight 181 lb 3.2 oz (82.2 kg), SpO2 96 %.  Gen:      No acute distress ENT:  mild nasal debris and erythema, no nasal polyps, mucus membranes moist Lungs:    No increased respiratory effort, symmetric chest wall excursion, clear to auscultation bilaterally, no wheezes or crackles CV:         Regular rate and rhythm; no murmurs, rubs, or gallops.  No pedal edema   Data Reviewed: Imaging:   PFTs:  PFT Results Latest Ref Rng & Units 09/07/2021  FVC-Pre L 3.54  FVC-Predicted Pre % 91  FVC-Post L 3.50  FVC-Predicted Post % 90  Pre FEV1/FVC % % 82  Post FEV1/FCV % % 81  FEV1-Pre L 2.90  FEV1-Predicted Pre % 100  FEV1-Post L 2.85  DLCO uncorrected ml/min/mmHg 21.45  DLCO UNC% % 92  DLCO corrected ml/min/mmHg 21.45  DLCO COR %Predicted % 92  DLVA Predicted % 102  TLC L 5.30  TLC %  Predicted % 85  RV % Predicted % 79   I have personally reviewed the patient's PFTs and normal pulmonary function  Labs: Lab Results  Component Value Date   WBC 4.3 03/25/2017   HGB 15.0 03/25/2017   HCT 44.4 03/25/2017   MCV 88.4 03/25/2017   PLT 177 03/25/2017   Lab Results  Component Value Date   NA 141 05/01/2021   K 4.7 05/01/2021   CL 106 05/01/2021   CO2 22 05/01/2021     Immunization status: Immunization History  Administered Date(s) Administered   Fluad Quad(high Dose 65+) 09/07/2021   PFIZER(Purple Top)SARS-COV-2 Vaccination 01/08/2020, 02/02/2020, 08/30/2020    Assessment:  Shortness of breath - improved Mild persistent asthma OSA on cpap Mast Cell Activation Syndrome Need for influenza vaccination  Plan/Recommendations:  High dose flu shot today.  Continue autoCPAP at current settings Will add astelin nasal spray to flonase for rhinitis.  Continue symbicort for asthma with prn albuterol  Return to Care: Return in about 6  months (around 03/08/2022).   Lenice Llamas, MD Pulmonary and [place]

## 2021-09-07 NOTE — Telephone Encounter (Signed)
Called patient, spoke to wife, per Dr. Shearon Stalls, CPAP settings are working great and continue with those settings

## 2021-09-07 NOTE — Progress Notes (Signed)
PFT done today. 

## 2021-09-07 NOTE — Patient Instructions (Addendum)
Please schedule follow up scheduled with myself in 6 months.  If my schedule is not open yet, we will contact you with a reminder closer to that time.  Keep up the good work with CPAP! I will call you if any changes need to be made.   Continue symbicort for asthma  Take flonase in the morning Take astelin at night  Salt water gargles can help.  Honey lozenges can help  Flonase/astelin - 1 spray on each side   Instructions for use: If you also use a saline nasal spray or rinse, use that first. Position the head with the chin slightly tucked. Use the right hand to spray into the left nostril and the right hand to spray into the left nostril.   Point the bottle away from the septum of your nose (cartilage that divides the two sides of your nose).  Hold the nostril closed on the opposite side from where you will spray Spray once and gently sniff to pull the medicine into the higher parts of your nose.  Don't sniff too hard as the medicine will drain down the back of your throat instead. Repeat with a second spray on the same side if prescribed. Repeat on the other side of your nose.

## 2021-09-23 DIAGNOSIS — G4733 Obstructive sleep apnea (adult) (pediatric): Secondary | ICD-10-CM | POA: Diagnosis not present

## 2021-10-01 ENCOUNTER — Other Ambulatory Visit: Payer: Self-pay

## 2021-10-01 ENCOUNTER — Ambulatory Visit (INDEPENDENT_AMBULATORY_CARE_PROVIDER_SITE_OTHER): Payer: Medicare Other

## 2021-10-01 DIAGNOSIS — J4540 Moderate persistent asthma, uncomplicated: Secondary | ICD-10-CM | POA: Diagnosis not present

## 2021-10-11 ENCOUNTER — Other Ambulatory Visit: Payer: Medicare Other

## 2021-10-11 ENCOUNTER — Other Ambulatory Visit: Payer: Self-pay

## 2021-10-11 DIAGNOSIS — Z79899 Other long term (current) drug therapy: Secondary | ICD-10-CM | POA: Diagnosis not present

## 2021-10-11 LAB — LIPID PANEL
Chol/HDL Ratio: 3.5 ratio (ref 0.0–5.0)
Cholesterol, Total: 141 mg/dL (ref 100–199)
HDL: 40 mg/dL (ref >39)
LDL Chol Calc (NIH): 76 mg/dL (ref 0–99)
Triglycerides: 143 mg/dL (ref 0–149)
VLDL Cholesterol Cal: 25 mg/dL (ref 5–40)

## 2021-10-11 LAB — ALT: ALT: 28 IU/L (ref 0–44)

## 2021-10-15 ENCOUNTER — Telehealth: Payer: Self-pay

## 2021-10-15 MED ORDER — ROSUVASTATIN CALCIUM 40 MG PO TABS: 40.0000 mg | ORAL_TABLET | Freq: Every day | ORAL | 3 refills | Status: AC

## 2021-10-15 NOTE — Telephone Encounter (Signed)
-----   Message from Sueanne Margarita, MD sent at 10/15/2021 11:32 AM EST ----- Increase Crestor to 40 mg daily and repeat FLP and ALT in 6 weeks ----- Message ----- From: Antonieta Iba, RN Sent: 10/15/2021  10:52 AM EST To: Sueanne Margarita, MD  The patient has been notified of the result and verbalized understanding.  All questions (if any) were answered. Antonieta Iba, RN 10/15/2021 10:48 AM   Patient did increase his Crestor to 20 mg daily

## 2021-10-15 NOTE — Telephone Encounter (Signed)
The patient has been notified of the result and verbalized understanding.  All questions (if any) were answered. Joshua Iba, RN 10/15/2021 2:44 PM  Patient will increase his Crestor to 40 mg daily. He is having lab work done at the end of December by his PCP. He will have results sent to [place].

## 2021-10-24 DIAGNOSIS — G4733 Obstructive sleep apnea (adult) (pediatric): Secondary | ICD-10-CM | POA: Diagnosis not present

## 2021-10-29 ENCOUNTER — Ambulatory Visit (INDEPENDENT_AMBULATORY_CARE_PROVIDER_SITE_OTHER): Payer: Medicare Other | Admitting: *Deleted

## 2021-10-29 ENCOUNTER — Other Ambulatory Visit: Payer: Self-pay

## 2021-10-29 DIAGNOSIS — J4540 Moderate persistent asthma, uncomplicated: Secondary | ICD-10-CM | POA: Diagnosis not present

## 2021-11-22 DIAGNOSIS — K267 Chronic duodenal ulcer without hemorrhage or perforation: Secondary | ICD-10-CM | POA: Diagnosis not present

## 2021-11-22 DIAGNOSIS — J4540 Moderate persistent asthma, uncomplicated: Secondary | ICD-10-CM | POA: Diagnosis not present

## 2021-11-22 DIAGNOSIS — E039 Hypothyroidism, unspecified: Secondary | ICD-10-CM | POA: Diagnosis not present

## 2021-11-22 DIAGNOSIS — M6208 Separation of muscle (nontraumatic), other site: Secondary | ICD-10-CM | POA: Diagnosis not present

## 2021-11-22 DIAGNOSIS — Z0000 Encounter for general adult medical examination without abnormal findings: Secondary | ICD-10-CM | POA: Diagnosis not present

## 2021-11-22 DIAGNOSIS — E7800 Pure hypercholesterolemia, unspecified: Secondary | ICD-10-CM | POA: Diagnosis not present

## 2021-11-22 DIAGNOSIS — K257 Chronic gastric ulcer without hemorrhage or perforation: Secondary | ICD-10-CM | POA: Diagnosis not present

## 2021-11-23 DIAGNOSIS — N1831 Chronic kidney disease, stage 3a: Secondary | ICD-10-CM | POA: Diagnosis not present

## 2021-11-23 DIAGNOSIS — G4733 Obstructive sleep apnea (adult) (pediatric): Secondary | ICD-10-CM | POA: Diagnosis not present

## 2021-11-27 ENCOUNTER — Other Ambulatory Visit: Payer: Self-pay

## 2021-11-27 ENCOUNTER — Ambulatory Visit (INDEPENDENT_AMBULATORY_CARE_PROVIDER_SITE_OTHER): Payer: Medicare HMO | Admitting: *Deleted

## 2021-11-27 DIAGNOSIS — J4540 Moderate persistent asthma, uncomplicated: Secondary | ICD-10-CM

## 2021-11-30 DIAGNOSIS — N281 Cyst of kidney, acquired: Secondary | ICD-10-CM | POA: Diagnosis not present

## 2021-11-30 DIAGNOSIS — N1831 Chronic kidney disease, stage 3a: Secondary | ICD-10-CM | POA: Diagnosis not present

## 2021-11-30 DIAGNOSIS — N1330 Unspecified hydronephrosis: Secondary | ICD-10-CM | POA: Diagnosis not present

## 2021-11-30 DIAGNOSIS — N183 Chronic kidney disease, stage 3 unspecified: Secondary | ICD-10-CM | POA: Diagnosis not present

## 2021-12-12 DIAGNOSIS — N189 Chronic kidney disease, unspecified: Secondary | ICD-10-CM | POA: Diagnosis not present

## 2021-12-12 DIAGNOSIS — N281 Cyst of kidney, acquired: Secondary | ICD-10-CM | POA: Diagnosis not present

## 2021-12-25 ENCOUNTER — Other Ambulatory Visit: Payer: Self-pay

## 2021-12-25 ENCOUNTER — Ambulatory Visit (INDEPENDENT_AMBULATORY_CARE_PROVIDER_SITE_OTHER): Payer: Medicare HMO

## 2021-12-25 DIAGNOSIS — J4540 Moderate persistent asthma, uncomplicated: Secondary | ICD-10-CM

## 2022-01-03 ENCOUNTER — Ambulatory Visit: Payer: Medicare Other | Admitting: Allergy & Immunology

## 2022-01-08 ENCOUNTER — Ambulatory Visit (INDEPENDENT_AMBULATORY_CARE_PROVIDER_SITE_OTHER): Payer: Medicare Other | Admitting: Allergy & Immunology

## 2022-01-08 ENCOUNTER — Encounter: Payer: Self-pay | Admitting: Allergy & Immunology

## 2022-01-08 ENCOUNTER — Other Ambulatory Visit: Payer: Self-pay

## 2022-01-08 VITALS — BP 118/72 | HR 83 | Temp 98.1°F | Resp 20 | Ht 65.75 in | Wt 179.0 lb

## 2022-01-08 DIAGNOSIS — G4733 Obstructive sleep apnea (adult) (pediatric): Secondary | ICD-10-CM

## 2022-01-08 DIAGNOSIS — D4709 Other mast cell neoplasms of uncertain behavior: Secondary | ICD-10-CM

## 2022-01-08 DIAGNOSIS — J4540 Moderate persistent asthma, uncomplicated: Secondary | ICD-10-CM

## 2022-01-08 DIAGNOSIS — J3089 Other allergic rhinitis: Secondary | ICD-10-CM

## 2022-01-08 NOTE — Patient Instructions (Addendum)
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  - Continue with Allegra twice daily.   2. SOB (shortness of breath) - Lung testing looks great today.  - I think you are on the right track with the Symbicort. - Daily controller medication(s): Symbicort 160/4.19mcg two puffs twice daily with spacer - Prior to physical activity: albuterol 2 puffs 10-15 minutes before physical activity. - Rescue medications: albuterol 4 puffs every 4-6 hours as needed - Asthma control goals:  * Full participation in all desired activities (may need albuterol before activity) * Albuterol use two time or less a week on average (not counting use with activity) * Cough interfering with sleep two time or less a month * Oral steroids no more than once a year * No hospitalizations  3. Perennial allergic rhinitis (dust mites, cockroach) - Continue with Allegra twice daily. - Continue with Astelin one to two sprays at night. - We could dry out your nasal passage more with some medications, but that would make the nose bleeds worse.   4. Return in about 6 months (around 07/08/2022).    Please inform us of any Emergency Department visits, hospitalizations, or changes in symptoms. Call us before going to the ED for breathing or allergy symptoms since we might be able to fit you in for a sick visit. Feel free to contact us anytime with any questions, problems, or concerns.  It was a pleasure to see you and your family again today!  Websites that have reliable patient information: 1. American Academy of Asthma, Allergy, and Immunology: www.aaaai.org 2. Food Allergy Research and Education (FARE): foodallergy.org 3. Mothers of Asthmatics: http://www.asthmacommunitynetwork.org 4. American College of Allergy, Asthma, and Immunology: www.acaai.org   COVID-19 Vaccine Information can be found at: ShippingScam.[email or web address].uk For questions related to  vaccine distribution or appointments, please email [email or web address] or call [phone].   We realize that you might be concerned about having an allergic reaction to the COVID19 vaccines. To help with that concern, WE ARE OFFERING THE COVID19 VACCINES IN OUR OFFICE! Ask the front desk for dates!     Like [place] on [place] and Instagram for our latest updates!      A healthy democracy works best when [employer] participate! Make sure you are registered to vote! If you have moved or changed any of your contact information, you will need to get this updated before voting!  In some cases, you MAY be able to register to vote online: [email or web address]    4

## 2022-01-08 NOTE — Progress Notes (Signed)
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 Shearon Stalls)   Perennial allergic rhinitis (dust mites, cockroach)   Overall, Joshua Haney is doing very well.  He does still have some postnasal drip reported, especially in the morning.  I could work on drying him out more with more anticholinergic activity with something such as carbinoxamine or Atrovent, but I hesitate to do that because he is already hoarse from the CPAP.  Therefore, we are just going continue with what we are doing.  He and his wife are okay with this plan.  Plan/Recommendations:   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  - Continue with Allegra twice daily.   2. SOB (shortness of breath) - Lung testing looks great today.  - I think you are on the right track with the Symbicort. - Daily controller medication(s): Symbicort 160/4.48mcg two puffs twice daily with spacer - Prior to physical activity: albuterol 2 puffs 10-15 minutes before physical activity. - Rescue medications: albuterol 4 puffs every 4-6 hours as needed - Asthma control goals:  * Full participation in all desired activities (may need albuterol before activity) * Albuterol use two time or less a week on average (not counting use with activity) * Cough interfering with sleep two time or less a month * Oral steroids no more than once a year * No hospitalizations  3. Perennial allergic rhinitis (dust mites, cockroach) - Continue with Allegra twice daily. - Continue with Astelin one to two sprays at night. - We could dry out your nasal passage more with some medications, but that would make the nose bleeds worse.   4. Return in about 6 months (around 07/08/2022).    Subjective:   Joshua Haney is a 66 y.o. male presenting today for follow up of  Chief Complaint  Patient presents with   Asthma   Follow-up    Doing pretty good. Had to use the inhaler a couple of times.   Allergic Rhinitis     Nose bleeds from time to time when he blows his nose.    Joshua Haney has a history of the following: Patient Active Problem List   Diagnosis Date Noted   OSA (obstructive sleep apnea) 07/03/2021   Moderate persistent asthma without complication 57/32/2025   Mastocytosis 05/22/2021   Perennial allergic rhinitis 05/22/2021   SOB (shortness of breath) 05/22/2021    History obtained from: chart review and patient.  Joshua Haney is a 66 y.o. male presenting for a follow up visit.  He was last seen in August 2022.  At that time, we gave him a Xolair in clinic.  He continued with Allegra twice daily.  For shortness of breath, we continue with Symbicort 2 puffs twice daily as well as albuterol as needed.  For his allergic rhinitis, we will continue with Allegra.  We recommended talking to his pulmonologist about a CPAP.  Since the last visit, he has done very well.   Asthma/Respiratory Symptom History: When he coughs, he still feels some thightness in his chest. He is on the Symbicort two puffs twice daily. He has been gaining  weight and is wondering whether this is related to his asthma or his thyroid.   He reports some hoarseness with  his CPAP machine. He is using the humidification chamber with the distilled water. He also reports that his nasal passages get so dry that he has nosebleeds. These do no requires cauterization or anything, but they are bothersome to him.  Allergic Rhinitis Symptom History: He has a lot of phlegm in the morning. He has Astelin two sprays at night. This was changed by Dr. Shearon Stalls. He thinks that this has helped some. When he blows his nose, he has nosebleeds.  He does have some postnasal drip that is worse in the mornings. This is after he has been using his CPAP  machine. But they are not interested in adding more anticholinergic activity to help decrease mucous production.   GERD Symptom History: He is on pantroprazole for GERD. He has a history of ulcers.   He has not had any allergic reactions at all since last visit.  The Xolair seems to have helped quite a bit.  He is doing well with the Xolair without any side effects.  Otherwise, there have been no changes to his past medical history, surgical history, family history, or social history.    Review of Systems  Constitutional: Negative.  Negative for chills, fever, malaise/fatigue and weight loss.  HENT: Negative.  Negative for congestion, ear discharge, ear pain and sinus pain.   Eyes:  Negative for pain, discharge and redness.  Respiratory:  Negative for cough, sputum production, shortness of breath and wheezing.   Cardiovascular: Negative.  Negative for chest pain and palpitations.  Gastrointestinal:  Negative for abdominal pain, constipation, diarrhea, heartburn, nausea and vomiting.  Skin: Negative.  Negative for itching and rash.  Neurological:  Negative for dizziness and headaches.  Endo/Heme/Allergies:  Negative for environmental allergies. Does not bruise/bleed easily.      Objective:   Blood pressure 118/72, pulse 83, temperature 98.1 F (36.7 C), temperature source Temporal, resp. rate 20, height 5' 5.75" (1.67 m), weight 179 lb (81.2 kg), SpO2 97 %. Body mass index is 29.11 kg/m.    Physical Exam Vitals reviewed.  Constitutional:      Appearance: He is well-developed.     Comments: Very pleasant male.  Hard of hearing.  HENT:     Head: Normocephalic and atraumatic.     Right Ear: Tympanic membrane, ear canal and external ear normal.     Left Ear: Tympanic membrane, ear canal and external ear normal.     Nose: No nasal deformity, septal deviation, mucosal edema or rhinorrhea.     Right Turbinates: Enlarged and swollen.     Left Turbinates: Enlarged and swollen.      Right Sinus: No maxillary sinus tenderness or frontal sinus tenderness.     Left Sinus: No maxillary sinus tenderness or frontal sinus tenderness.     Mouth/Throat:     Mouth: Mucous membranes are not pale and not dry.     Pharynx: Uvula midline.     Comments: Throat seems well-hydrated today.  Oropharynx without cobblestoning. Eyes:     General: Lids are normal. No allergic shiner.       Right eye: No discharge.        Left eye: No discharge.     Conjunctiva/sclera: Conjunctivae normal.     Right eye: Right conjunctiva is not injected. No chemosis.    Left eye: Left conjunctiva is not injected. No chemosis.    Pupils: Pupils are equal, round, and reactive to light.  Cardiovascular:     Rate and Rhythm: Normal rate and regular  rhythm.     Heart sounds: Normal heart sounds.  Pulmonary:     Effort: Pulmonary effort is normal. No tachypnea, accessory muscle usage or respiratory distress.     Breath sounds: Normal breath sounds. No wheezing, rhonchi or rales.     Comments: Hoarse.  Moving air well in all lung fields.  No increased work of breathing. Chest:     Chest wall: No tenderness.  Lymphadenopathy:     Cervical: No cervical adenopathy.  Skin:    General: Skin is warm.     Capillary Refill: Capillary refill takes less than 2 seconds.     Coloration: Skin is not pale.     Findings: No abrasion, erythema, petechiae or rash. Rash is not papular, urticarial or vesicular.     Comments: No eczematous or urticarial lesions noted.  Neurological:     Mental Status: He is alert.  Psychiatric:        Behavior: Behavior is cooperative.     Diagnostic studies:    Spirometry: results normal (FEV1: 2.76/100%, FVC: 3.37/91%, FEV1/FVC: 82%).    Spirometry consistent with normal pattern.   Allergy Studies: none        Salvatore Marvel, MD  [facility]

## 2022-01-09 ENCOUNTER — Encounter: Payer: Self-pay | Admitting: Allergy & Immunology

## 2022-01-14 ENCOUNTER — Telehealth: Payer: Self-pay | Admitting: Allergy & Immunology

## 2022-01-14 MED ORDER — BREZTRI AEROSPHERE 160-9-4.8 MCG/ACT IN AERO
2.0000 | INHALATION_SPRAY | Freq: Two times a day (BID) | RESPIRATORY_TRACT | 1 refills | Status: DC
Start: 2022-01-14 — End: 2022-07-26

## 2022-01-14 NOTE — Telephone Encounter (Signed)
Spoke with wife and informed her that refills have been sent to the requested pharmacy. Wife verbalized understanding.

## 2022-01-14 NOTE — Telephone Encounter (Signed)
Wife called in and stated Joshua Haney liked the Ronald sample he was given and would like a 90 day supply called in to his pharmacy at [facility] in [place] on [address].

## 2022-01-22 ENCOUNTER — Ambulatory Visit: Payer: Medicare HMO

## 2022-01-22 DIAGNOSIS — N1831 Chronic kidney disease, stage 3a: Secondary | ICD-10-CM | POA: Diagnosis not present

## 2022-01-22 DIAGNOSIS — G4733 Obstructive sleep apnea (adult) (pediatric): Secondary | ICD-10-CM | POA: Diagnosis not present

## 2022-01-22 DIAGNOSIS — M6208 Separation of muscle (nontraumatic), other site: Secondary | ICD-10-CM | POA: Diagnosis not present

## 2022-01-22 DIAGNOSIS — N281 Cyst of kidney, acquired: Secondary | ICD-10-CM | POA: Diagnosis not present

## 2022-01-22 DIAGNOSIS — E039 Hypothyroidism, unspecified: Secondary | ICD-10-CM | POA: Diagnosis not present

## 2022-01-22 DIAGNOSIS — Z139 Encounter for screening, unspecified: Secondary | ICD-10-CM | POA: Diagnosis not present

## 2022-01-23 ENCOUNTER — Ambulatory Visit (INDEPENDENT_AMBULATORY_CARE_PROVIDER_SITE_OTHER): Payer: Medicare Other | Admitting: *Deleted

## 2022-01-23 ENCOUNTER — Other Ambulatory Visit: Payer: Self-pay

## 2022-01-23 DIAGNOSIS — J4540 Moderate persistent asthma, uncomplicated: Secondary | ICD-10-CM

## 2022-02-01 DIAGNOSIS — G4733 Obstructive sleep apnea (adult) (pediatric): Secondary | ICD-10-CM | POA: Diagnosis not present

## 2022-02-05 ENCOUNTER — Encounter: Payer: Self-pay | Admitting: Cardiology

## 2022-02-05 DIAGNOSIS — R7989 Other specified abnormal findings of blood chemistry: Secondary | ICD-10-CM

## 2022-02-05 DIAGNOSIS — Z79899 Other long term (current) drug therapy: Secondary | ICD-10-CM

## 2022-02-20 ENCOUNTER — Ambulatory Visit: Payer: Medicare Other | Admitting: *Deleted

## 2022-02-20 ENCOUNTER — Other Ambulatory Visit: Payer: Self-pay

## 2022-02-20 DIAGNOSIS — J4540 Moderate persistent asthma, uncomplicated: Secondary | ICD-10-CM

## 2022-02-20 DIAGNOSIS — N2581 Secondary hyperparathyroidism of renal origin: Secondary | ICD-10-CM | POA: Diagnosis not present

## 2022-02-20 DIAGNOSIS — N1831 Chronic kidney disease, stage 3a: Secondary | ICD-10-CM | POA: Diagnosis not present

## 2022-02-20 DIAGNOSIS — I129 Hypertensive chronic kidney disease with stage 1 through stage 4 chronic kidney disease, or unspecified chronic kidney disease: Secondary | ICD-10-CM | POA: Diagnosis not present

## 2022-02-21 DIAGNOSIS — G4733 Obstructive sleep apnea (adult) (pediatric): Secondary | ICD-10-CM | POA: Diagnosis not present

## 2022-03-11 ENCOUNTER — Other Ambulatory Visit: Payer: Medicare Other

## 2022-03-20 ENCOUNTER — Ambulatory Visit (INDEPENDENT_AMBULATORY_CARE_PROVIDER_SITE_OTHER): Payer: Medicare Other | Admitting: *Deleted

## 2022-03-20 DIAGNOSIS — J4540 Moderate persistent asthma, uncomplicated: Secondary | ICD-10-CM | POA: Diagnosis not present

## 2022-03-24 DIAGNOSIS — G4733 Obstructive sleep apnea (adult) (pediatric): Secondary | ICD-10-CM | POA: Diagnosis not present

## 2022-04-01 DIAGNOSIS — N1831 Chronic kidney disease, stage 3a: Secondary | ICD-10-CM | POA: Diagnosis not present

## 2022-04-04 DIAGNOSIS — H25813 Combined forms of age-related cataract, bilateral: Secondary | ICD-10-CM | POA: Diagnosis not present

## 2022-04-12 DIAGNOSIS — N182 Chronic kidney disease, stage 2 (mild): Secondary | ICD-10-CM | POA: Diagnosis not present

## 2022-04-12 DIAGNOSIS — D631 Anemia in chronic kidney disease: Secondary | ICD-10-CM | POA: Diagnosis not present

## 2022-04-12 DIAGNOSIS — N2581 Secondary hyperparathyroidism of renal origin: Secondary | ICD-10-CM | POA: Diagnosis not present

## 2022-04-12 DIAGNOSIS — I129 Hypertensive chronic kidney disease with stage 1 through stage 4 chronic kidney disease, or unspecified chronic kidney disease: Secondary | ICD-10-CM | POA: Diagnosis not present

## 2022-04-14 ENCOUNTER — Other Ambulatory Visit: Payer: Self-pay | Admitting: Allergy & Immunology

## 2022-04-17 ENCOUNTER — Ambulatory Visit: Payer: Medicare Other | Admitting: *Deleted

## 2022-04-17 DIAGNOSIS — J4540 Moderate persistent asthma, uncomplicated: Secondary | ICD-10-CM | POA: Diagnosis not present

## 2022-04-23 DIAGNOSIS — G4733 Obstructive sleep apnea (adult) (pediatric): Secondary | ICD-10-CM | POA: Diagnosis not present

## 2022-04-28 ENCOUNTER — Other Ambulatory Visit: Payer: Self-pay | Admitting: Cardiology

## 2022-05-15 ENCOUNTER — Ambulatory Visit (INDEPENDENT_AMBULATORY_CARE_PROVIDER_SITE_OTHER): Payer: Medicare Other | Admitting: *Deleted

## 2022-05-15 DIAGNOSIS — J4540 Moderate persistent asthma, uncomplicated: Secondary | ICD-10-CM | POA: Diagnosis not present

## 2022-05-23 ENCOUNTER — Other Ambulatory Visit: Payer: Self-pay | Admitting: Cardiology

## 2022-05-24 DIAGNOSIS — G4733 Obstructive sleep apnea (adult) (pediatric): Secondary | ICD-10-CM | POA: Diagnosis not present

## 2022-06-12 ENCOUNTER — Ambulatory Visit: Payer: Medicare Other | Admitting: *Deleted

## 2022-06-12 DIAGNOSIS — J4540 Moderate persistent asthma, uncomplicated: Secondary | ICD-10-CM

## 2022-06-23 DIAGNOSIS — G4733 Obstructive sleep apnea (adult) (pediatric): Secondary | ICD-10-CM | POA: Diagnosis not present

## 2022-07-09 ENCOUNTER — Encounter: Payer: Self-pay | Admitting: Allergy & Immunology

## 2022-07-09 ENCOUNTER — Ambulatory Visit: Payer: Medicare Other | Admitting: Allergy & Immunology

## 2022-07-09 ENCOUNTER — Ambulatory Visit: Payer: Medicare Other

## 2022-07-09 VITALS — BP 116/70 | HR 80 | Temp 97.9°F

## 2022-07-09 DIAGNOSIS — J3089 Other allergic rhinitis: Secondary | ICD-10-CM

## 2022-07-09 DIAGNOSIS — R490 Dysphonia: Secondary | ICD-10-CM

## 2022-07-09 DIAGNOSIS — J4540 Moderate persistent asthma, uncomplicated: Secondary | ICD-10-CM

## 2022-07-09 DIAGNOSIS — D4709 Other mast cell neoplasms of uncertain behavior: Secondary | ICD-10-CM

## 2022-07-09 NOTE — Patient Instructions (Addendum)
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  - Continue with Allegra, but change to once in the morning (since the nighttime dosing much be making things worse with your hoarseness).   2. SOB (shortness of breath) - Lung testing looks great today.  - I think you are on the right track with your inhaler. - Daily controller medication(s): Breztri two puffs twice daily with spacer - Prior to physical activity: albuterol 2 puffs 10-15 minutes before physical activity. - Rescue medications: albuterol 4 puffs every 4-6 hours as needed - Asthma control goals:  * Full participation in all desired activities (may need albuterol before activity) * Albuterol use two time or less a week on average (not counting use with activity) * Cough interfering with sleep two time or less a month * Oral steroids no more than once a year * No hospitalizations  3. Perennial allergic rhinitis (dust mites, cockroach) - Continue with Allegra twice daily. - Continue with Astelin one to two sprays at night.  4. Return in about 6 months (around 01/09/2023).    Please inform us of any Emergency Department visits, hospitalizations, or changes in symptoms. Call us before going to the ED for breathing or allergy symptoms since we might be able to fit you in for a sick visit. Feel free to contact us anytime with any questions, problems, or concerns.  It was a pleasure to see you and your family again today!  Websites that have reliable patient information: 1. American Academy of Asthma, Allergy, and Immunology: www.aaaai.org 2. Food Allergy Research and Education (FARE): foodallergy.org 3. Mothers of Asthmatics: http://www.asthmacommunitynetwork.org 4. American College of Allergy, Asthma, and Immunology: www.acaai.org   COVID-19 Vaccine Information can be found at: ShippingScam.co.uk For questions related to vaccine distribution or  appointments, please email [email or web address] or call [phone].   We realize that you might be concerned about having an allergic reaction to the COVID19 vaccines. To help with that concern, WE ARE OFFERING THE COVID19 VACCINES IN OUR OFFICE! Ask the front desk for dates!     "Like" [place] on Facebook and Instagram for our latest updates!      A healthy democracy works best when [employer] participate! Make sure you are registered to vote! If you have moved or changed any of your contact information, you will need to get this updated before voting!  In some cases, you MAY be able to register to vote online: [email or web address]    4

## 2022-07-09 NOTE — Progress Notes (Signed)
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 Shearon Stalls)   Perennial allergic rhinitis (dust mites, cockroach)  Recent tickbite  Plan/Recommendations:   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  - Continue with Allegra, but change to once in the morning (since the nighttime dosing much be making things worse with your hoarseness).   2. SOB (shortness of breath) - Lung testing looks great today.  - I think you are on the right track with your inhaler. - Daily controller medication(s): Breztri two puffs twice daily with spacer - Prior to physical activity: albuterol 2 puffs 10-15 minutes before physical activity. - Rescue medications: albuterol 4 puffs every 4-6 hours as needed - Asthma control goals:  * Full participation in all desired activities (may need albuterol before activity) * Albuterol use two time or less a week on average (not counting use with activity) * Cough interfering with sleep two time or less a month * Oral steroids no more than once a year * No hospitalizations  3. Perennial allergic rhinitis (dust mites, cockroach) - Continue with Allegra twice daily. - Continue with Astelin one to two sprays at night.  4. Return in about 6 months (around 01/09/2023).     Subjective:   Joshua Haney is a 66 y.o. male presenting today for follow up of  Chief Complaint  Patient presents with   Follow-up    Joshua Haney has a history of the following: Patient Active Problem List   Diagnosis Date Noted   OSA (obstructive sleep apnea) 07/03/2021   Moderate persistent asthma without complication 51/12/5850   Mastocytosis 05/22/2021   Perennial allergic rhinitis 05/22/2021   SOB (shortness of  breath) 05/22/2021    History obtained from: chart review and patient.  Joshua Haney is a 66 y.o. male presenting for a follow up visit.  He was last seen in February 2023.  At that time, he was doing very well on Xolair every 4 weeks.  He also was on Allegra twice daily.  His lung testing looked phenomenal.  We continue with Symbicort 160 mcg 2 puffs twice daily.  We also continue with albuterol as needed.  For his rhinitis, we continue with Allegra twice daily as well as Astelin.  Since last visit, he has done well.   Asthma/Respiratory Symptom History: He is doing the Breztri two puffs twice daily. He remains on the Xolair.  The Judithann Sauger is affordable for him. He is using Xolair without a problem. Joshua Haney's asthma has been well controlled. He has not required rescue medication, experienced nocturnal awakenings due to lower respiratory symptoms, nor have activities of daily living been limited. He has required no Emergency Department or Urgent Care visits for his asthma. He has required zero courses of systemic steroids for asthma exacerbations since the last visit. ACT score today is 25, indicating excellent asthma symptom control.   He remains on the CPAP which does make him hoarse. He is on Protonix as well. However, the CPAP has helped with his sleep quality.   Allergic Rhinitis Symptom History: He remains on Allegra twice daily to help control his postnasal drip as well as decrease his episodes of anaphylaxis.  He is also on the Xolair for this indication as well.  He uses the Astelin at least once a day.  He has not needed antibiotics for any sinus infections.  Xolair injections are going well.  He has no adverse reactions to them.  They are fully covered by Medicare, although I think he is on free drug through the company.  He had a tickbite that occurred around one month ago. He estimates that it was not on there very long. This was one month ago and the lesion has all cleared up. He did not have  fevers or joint pains or anything consistent with Lyme's disease. He has had no sequelae.   Otherwise, there have been no changes to his past medical history, surgical history, family history, or social history.    Review of Systems  Constitutional: Negative.  Negative for chills, fever, malaise/fatigue and weight loss.  HENT: Negative.  Negative for congestion, ear discharge, ear pain and sinus pain.   Eyes:  Negative for pain, discharge and redness.  Respiratory:  Negative for cough, sputum production, shortness of breath and wheezing.   Cardiovascular: Negative.  Negative for chest pain and palpitations.  Gastrointestinal:  Negative for abdominal pain, constipation, diarrhea, heartburn, nausea and vomiting.  Skin: Negative.  Negative for itching and rash.  Neurological:  Negative for dizziness and headaches.  Endo/Heme/Allergies:  Negative for environmental allergies. Does not bruise/bleed easily.       Objective:   Blood pressure 116/70, pulse 80, temperature 97.9 F (36.6 C), SpO2 97 %. There is no height or weight on file to calculate BMI.    Physical Exam Vitals reviewed.  Constitutional:      Appearance: He is well-developed.     Comments: Very pleasant male.  Hard of hearing.  HENT:     Head: Normocephalic and atraumatic.     Right Ear: Tympanic membrane, ear canal and external ear normal.     Left Ear: Tympanic membrane, ear canal and external ear normal.     Nose: No nasal deformity, septal deviation, mucosal edema or rhinorrhea.     Right Turbinates: Enlarged and swollen.     Left Turbinates: Enlarged and swollen.     Right Sinus: No maxillary sinus tenderness or frontal sinus tenderness.     Left Sinus: No maxillary sinus tenderness or frontal sinus tenderness.     Mouth/Throat:     Mouth: Mucous membranes are not pale and not dry.     Pharynx: Uvula midline.     Comments: Throat seems well-hydrated today.  Oropharynx without cobblestoning. Eyes:      General: Lids are normal. No allergic shiner.       Right eye: No discharge.        Left eye: No discharge.     Conjunctiva/sclera: Conjunctivae normal.     Right eye: Right conjunctiva is not injected. No chemosis.    Left eye: Left conjunctiva is not injected. No chemosis.    Pupils: Pupils are equal, round, and reactive to light.  Cardiovascular:     Rate and Rhythm: Normal rate and regular rhythm.     Heart sounds: Normal heart sounds.  Pulmonary:     Effort: Pulmonary effort is normal. No tachypnea, accessory muscle usage or respiratory distress.     Breath sounds: Normal breath sounds. No wheezing, rhonchi or rales.     Comments: Hoarse.  Moving air well in all lung fields.  No increased work of breathing. Chest:     Chest wall: No tenderness.  Lymphadenopathy:     Cervical:  No cervical adenopathy.  Skin:    General: Skin is warm.     Capillary Refill: Capillary refill takes less than 2 seconds.     Coloration: Skin is not pale.     Findings: No abrasion, erythema, petechiae or rash. Rash is not papular, urticarial or vesicular.     Comments: No eczematous or urticarial lesions noted.  No excoriations.  Neurological:     Mental Status: He is alert.  Psychiatric:        Behavior: Behavior is cooperative.      Diagnostic studies:    Spirometry: results normal (FEV1: 2.91/101%, FVC: 3.38/90%, FEV1/FVC: 86%).    Spirometry consistent with normal pattern.   Allergy Studies: none        Salvatore Marvel, MD  [facility]

## 2022-07-10 ENCOUNTER — Ambulatory Visit: Payer: Medicare Other

## 2022-07-11 MED ORDER — BUDESONIDE-FORMOTEROL FUMARATE 160-4.5 MCG/ACT IN AERO: 2.0000 | INHALATION_SPRAY | Freq: Two times a day (BID) | RESPIRATORY_TRACT | 5 refills | Status: DC

## 2022-07-11 MED ORDER — FLUTICASONE PROPIONATE 50 MCG/ACT NA SUSP: NASAL | 1 refills | Status: DC

## 2022-07-11 MED ORDER — AZELASTINE HCL 0.1 % NA SOLN: 1.0000 | Freq: Every day | NASAL | 3 refills | Status: DC

## 2022-07-24 DIAGNOSIS — G4733 Obstructive sleep apnea (adult) (pediatric): Secondary | ICD-10-CM | POA: Diagnosis not present

## 2022-07-26 ENCOUNTER — Other Ambulatory Visit: Payer: Self-pay | Admitting: Allergy & Immunology

## 2022-08-07 ENCOUNTER — Ambulatory Visit (INDEPENDENT_AMBULATORY_CARE_PROVIDER_SITE_OTHER): Payer: Medicare Other | Admitting: *Deleted

## 2022-08-07 ENCOUNTER — Other Ambulatory Visit: Payer: Self-pay | Admitting: *Deleted

## 2022-08-07 ENCOUNTER — Ambulatory Visit: Payer: Medicare Other

## 2022-08-07 DIAGNOSIS — J4540 Moderate persistent asthma, uncomplicated: Secondary | ICD-10-CM | POA: Diagnosis not present

## 2022-08-07 MED ORDER — OMALIZUMAB 150 MG/ML ~~LOC~~ SOSY: 300.0000 mg | PREFILLED_SYRINGE | SUBCUTANEOUS | 11 refills | Status: DC

## 2022-08-21 ENCOUNTER — Ambulatory Visit: Payer: Medicare Other

## 2022-08-24 DIAGNOSIS — G4733 Obstructive sleep apnea (adult) (pediatric): Secondary | ICD-10-CM | POA: Diagnosis not present

## 2022-08-29 ENCOUNTER — Telehealth: Payer: Self-pay | Admitting: Allergy and Immunology

## 2022-08-29 DIAGNOSIS — R0981 Nasal congestion: Secondary | ICD-10-CM | POA: Diagnosis not present

## 2022-08-29 DIAGNOSIS — R509 Fever, unspecified: Secondary | ICD-10-CM | POA: Diagnosis not present

## 2022-08-29 DIAGNOSIS — M791 Myalgia, unspecified site: Secondary | ICD-10-CM | POA: Diagnosis not present

## 2022-08-29 NOTE — Telephone Encounter (Signed)
Wife states they are on the way back from [facility] and patient seems to have developed a sinus infection (which he gets every year). He is having chills, a lot of sinus pressure, congestion, and watery eyes since yesterday. He is taking all his allergy medications as directed. Wife wants to know if a medication can be called in or if he would need to come into office.

## 2022-09-02 NOTE — Telephone Encounter (Signed)
FYI: Zelma was informed of all information in your note the day he called - he was given the Prednisone. He and his wife have both tested positive for COVID and Urgent Care provided the Paxlovid.

## 2022-09-03 ENCOUNTER — Ambulatory Visit: Payer: Medicare Other

## 2022-09-04 ENCOUNTER — Ambulatory Visit: Payer: Medicare Other

## 2022-09-05 ENCOUNTER — Other Ambulatory Visit: Payer: Self-pay | Admitting: Allergy & Immunology

## 2022-09-09 IMAGING — CT CT CARDIAC CORONARY ARTERY CALCIUM SCORE
3 series · 14 of 20 positions shown, 15 images · non-contrast
Comparison: None.
COMPARISON: None.

Addendum:
EXAM:
OVER-READ INTERPRETATION  CT CHEST

The following report is an over-read performed by radiologist Dr.
Lo La Ramaglia [REDACTED] on 05/01/2021. This
over-read does not include interpretation of cardiac or coronary
anatomy or pathology. The coronary calcium score interpretation by
the cardiologist is attached.
CLINICAL DATA: Cardiovascular Disease Risk stratification
Coronary Calcium Score
TECHNIQUE: A gated, non-contrast computed tomography scan of the heart was
performed using 3mm slice thickness. Axial images were analyzed on a
dedicated workstation. Calcium scoring of the coronary arteries was
performed using the Agatston method.

[Series 2: casc 3.0 bv41 2 bestdiast 69 % · axial · 0.46mm/px · z∈[-220,-145]mm · 4 of 43 slices shown, 5 images]
[im 9/43  vessel]
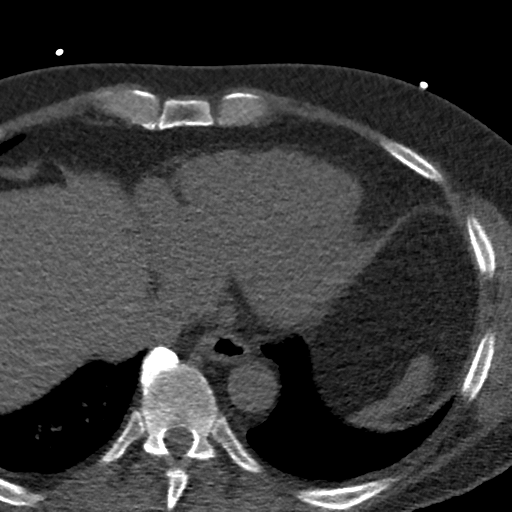
[im 9/43  lung]
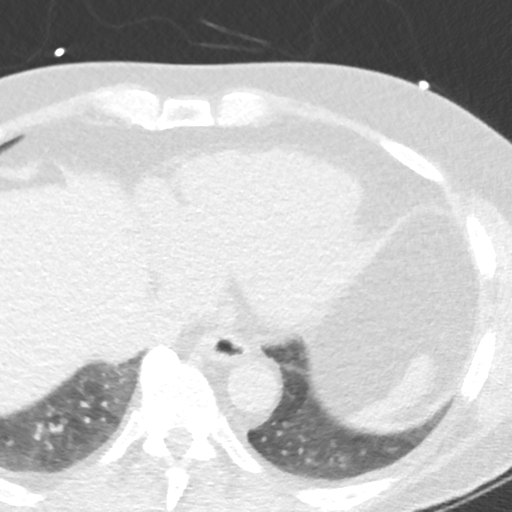
[im 17/43  vessel]
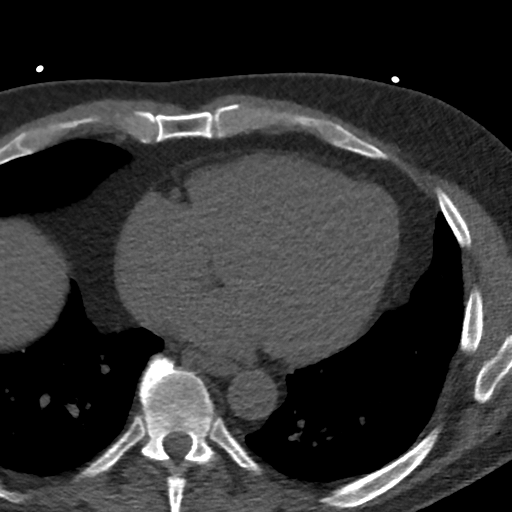
[im 26/43  vessel]
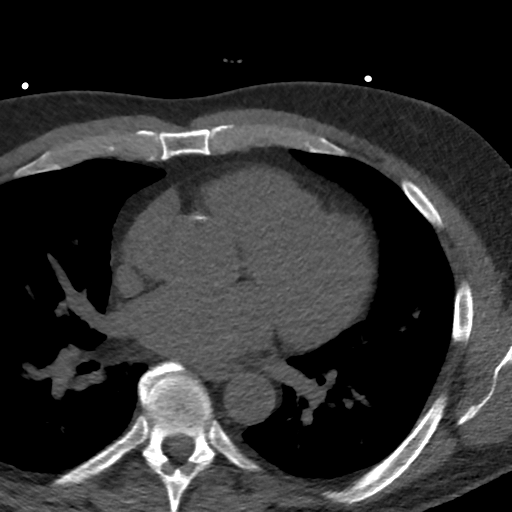
[im 34/43  vessel]
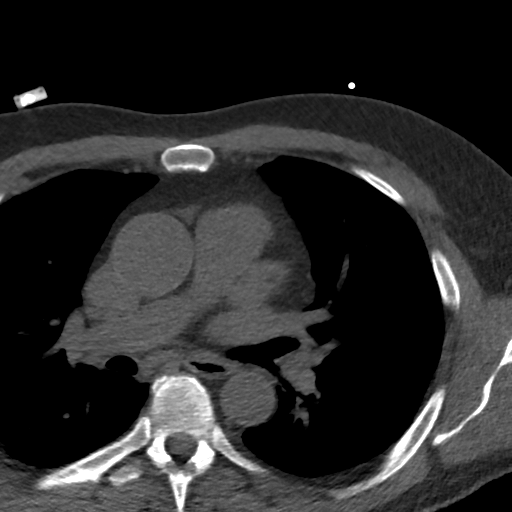

[Series 3: lung 69 % · axial · 0.72mm/px · z∈[-223,-139]mm · 5 of 43 slices shown]
[im 8/43  lung]
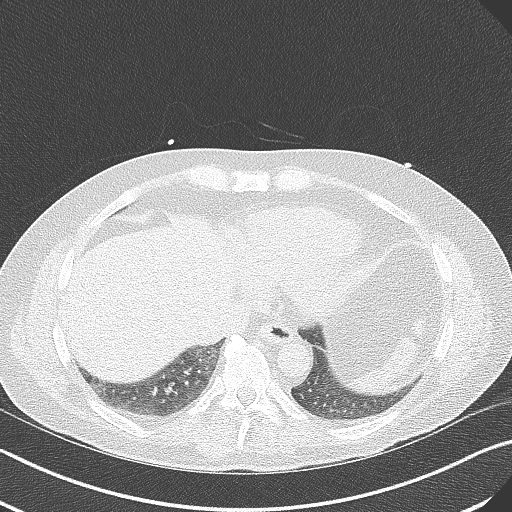
[im 15/43  lung]
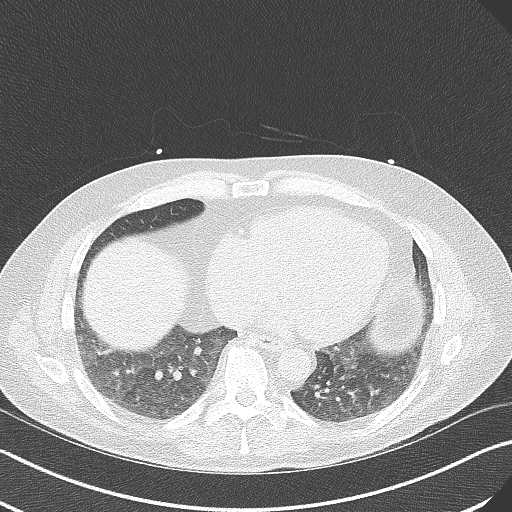
[im 22/43  lung]
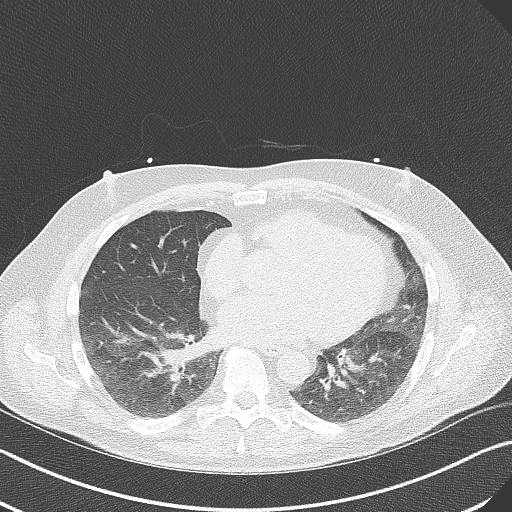
[im 29/43  lung]
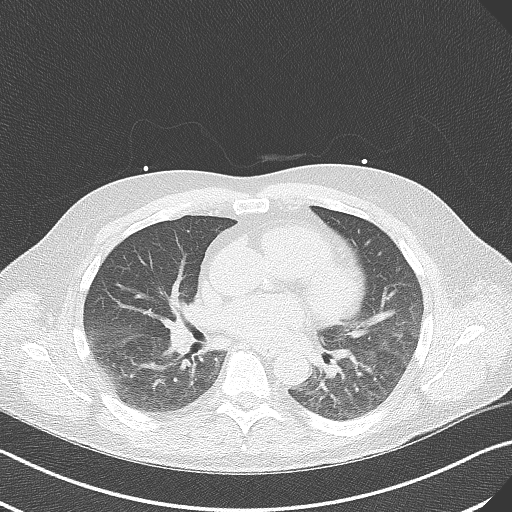
[im 36/43  lung]
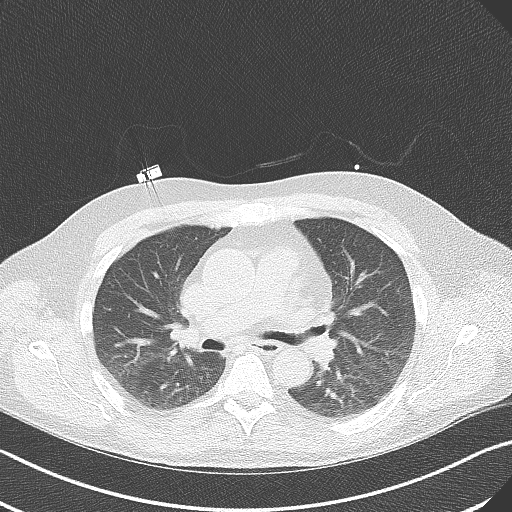

[Series 4: lung st 69 % · axial · 0.72mm/px · z∈[-223,-139]mm · 5 of 43 slices shown]
[im 8/43  lung]
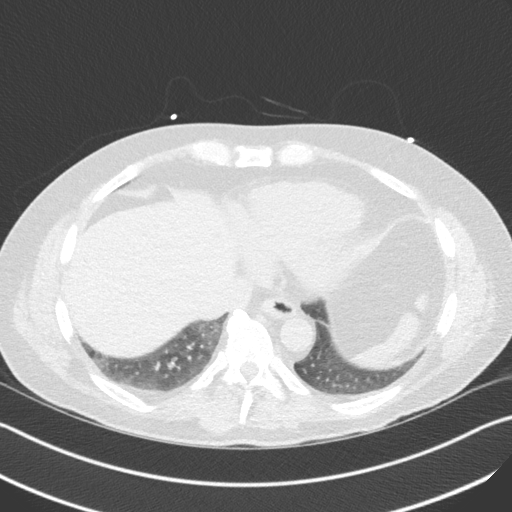
[im 15/43  lung]
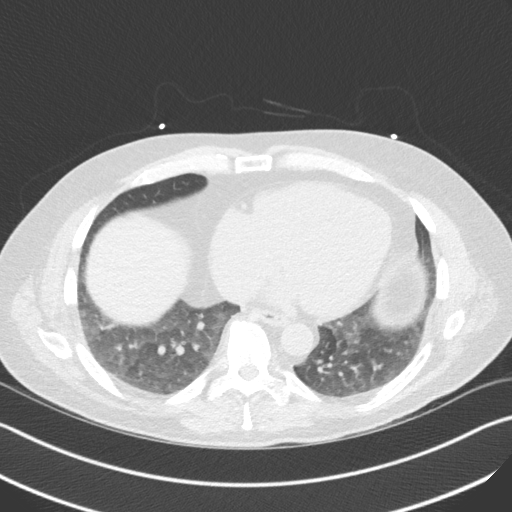
[im 22/43  lung]
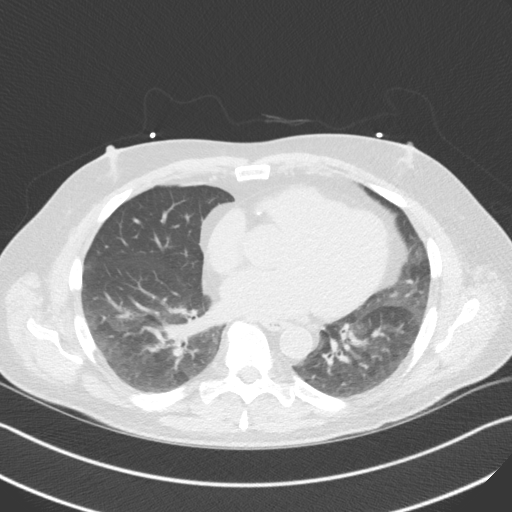
[im 29/43  lung]
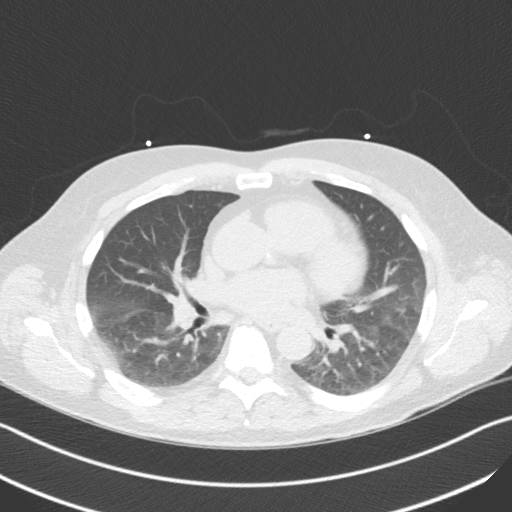
[im 36/43  lung]
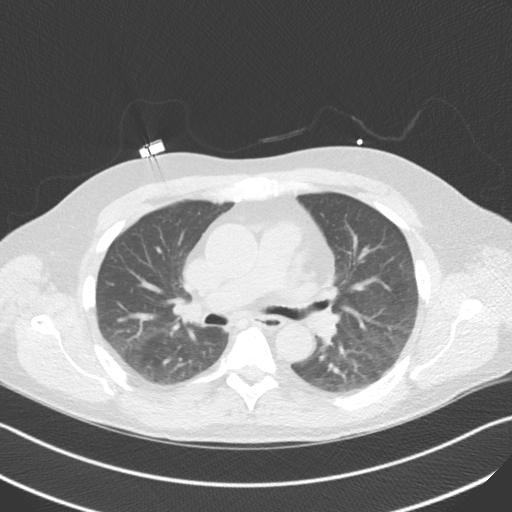

[14 of 20 positions shown; findings below may reference images not displayed]

FINDINGS: Atherosclerotic calcifications in the thoracic aorta. Within the
visualized portions of the thorax there are no suspicious appearing
pulmonary nodules or masses, there is no acute consolidative
airspace disease, no pleural effusions, no pneumothorax and no
lymphadenopathy. Visualized portions of the upper abdomen are
unremarkable. There are no aggressive appearing lytic or blastic
lesions noted in the visualized portions of the skeleton.
IMPRESSION: 1.  Aortic Atherosclerosis (Q8MG3-SE4.4).
FINDINGS: Coronary arteries: Normal origins.

Coronary Calcium Score:

Left main:

Left anterior descending artery: 0

Left circumflex artery: 0

Right coronary artery:

Total:

Percentile: 42nd

Pericardium: Normal.

Ascending Aorta: Borderline dilated ascending aorta measuring 38mm
at the level of the bifurcation of the main pulmonary artery.

Non-cardiac: See separate report from [REDACTED].
IMPRESSION: Coronary calcium score of 52.7. This was 42nd percentile for age-,
race-, and sex-matched controls.

Borderline dilated ascending aorta measuring 38mm at the level of
the bifurcation of the main pulmonary artery.



If CAC=0, it is reasonable to withhold statin therapy and reassess
in 5 to 10 years, as long as higher risk conditions are absent
(diabetes mellitus, family history of premature CHD in first degree
relatives (males <55 years; females <65 years), cigarette smoking,
or LDL >=190 mg/dL).

If CAC is 1 to 99, it is reasonable to initiate statin therapy for
patients >=55 years of age.

If CAC is >=100 or >=75th percentile, it is reasonable to initiate
statin therapy at any age.

Cardiology referral should be considered for patients with CAC
scores >=400 or >=75th percentile.

*0586 AHA/ACC/AACVPR/AAPA/ABC/MARICRUZ/AG/IMBACHI/Pogo/JOSHJAX/YIOGO/GHAZAL
Guideline on the Management of Blood Cholesterol: A Report of the
American College of Cardiology/American Heart Association Task Force
on Clinical Practice Guidelines. J Am Coll Cardiol.
6472;73(24):8108-8772.

*** End of Addendum ***
EXAM:
OVER-READ INTERPRETATION  CT CHEST

The following report is an over-read performed by radiologist Dr.
Lo La Ramaglia [REDACTED] on 05/01/2021. This
over-read does not include interpretation of cardiac or coronary
anatomy or pathology. The coronary calcium score interpretation by
the cardiologist is attached.
FINDINGS: Atherosclerotic calcifications in the thoracic aorta. Within the
visualized portions of the thorax there are no suspicious appearing
pulmonary nodules or masses, there is no acute consolidative
airspace disease, no pleural effusions, no pneumothorax and no
lymphadenopathy. Visualized portions of the upper abdomen are
unremarkable. There are no aggressive appearing lytic or blastic
lesions noted in the visualized portions of the skeleton.
IMPRESSION: 1.  Aortic Atherosclerosis (Q8MG3-SE4.4).

## 2022-09-10 ENCOUNTER — Ambulatory Visit (INDEPENDENT_AMBULATORY_CARE_PROVIDER_SITE_OTHER): Payer: Medicare Other | Admitting: *Deleted

## 2022-09-10 DIAGNOSIS — J4540 Moderate persistent asthma, uncomplicated: Secondary | ICD-10-CM

## 2022-09-23 DIAGNOSIS — G4733 Obstructive sleep apnea (adult) (pediatric): Secondary | ICD-10-CM | POA: Diagnosis not present

## 2022-10-07 ENCOUNTER — Other Ambulatory Visit: Payer: Self-pay | Admitting: Allergy & Immunology

## 2022-10-08 ENCOUNTER — Ambulatory Visit (INDEPENDENT_AMBULATORY_CARE_PROVIDER_SITE_OTHER): Payer: Medicare Other

## 2022-10-08 DIAGNOSIS — J4540 Moderate persistent asthma, uncomplicated: Secondary | ICD-10-CM | POA: Diagnosis not present

## 2022-11-05 ENCOUNTER — Ambulatory Visit: Payer: Medicare Other | Admitting: *Deleted

## 2022-11-05 DIAGNOSIS — J4540 Moderate persistent asthma, uncomplicated: Secondary | ICD-10-CM

## 2022-11-14 ENCOUNTER — Other Ambulatory Visit: Payer: Self-pay | Admitting: Allergy & Immunology

## 2022-11-26 DIAGNOSIS — E039 Hypothyroidism, unspecified: Secondary | ICD-10-CM | POA: Diagnosis not present

## 2022-11-26 DIAGNOSIS — E7800 Pure hypercholesterolemia, unspecified: Secondary | ICD-10-CM | POA: Diagnosis not present

## 2022-11-26 DIAGNOSIS — Z0000 Encounter for general adult medical examination without abnormal findings: Secondary | ICD-10-CM | POA: Diagnosis not present

## 2022-11-26 DIAGNOSIS — R7989 Other specified abnormal findings of blood chemistry: Secondary | ICD-10-CM | POA: Diagnosis not present

## 2022-11-26 DIAGNOSIS — Z9181 History of falling: Secondary | ICD-10-CM | POA: Diagnosis not present

## 2022-12-02 ENCOUNTER — Ambulatory Visit (INDEPENDENT_AMBULATORY_CARE_PROVIDER_SITE_OTHER): Payer: Medicare Other | Admitting: *Deleted

## 2022-12-02 DIAGNOSIS — J4540 Moderate persistent asthma, uncomplicated: Secondary | ICD-10-CM

## 2022-12-03 ENCOUNTER — Ambulatory Visit: Payer: Medicare Other

## 2022-12-30 ENCOUNTER — Ambulatory Visit (INDEPENDENT_AMBULATORY_CARE_PROVIDER_SITE_OTHER): Payer: Medicare Other | Admitting: *Deleted

## 2022-12-30 DIAGNOSIS — J4540 Moderate persistent asthma, uncomplicated: Secondary | ICD-10-CM

## 2023-01-06 ENCOUNTER — Other Ambulatory Visit: Payer: Self-pay | Admitting: Allergy & Immunology

## 2023-01-09 ENCOUNTER — Ambulatory Visit: Payer: Medicare Other | Admitting: Allergy & Immunology

## 2023-01-09 ENCOUNTER — Other Ambulatory Visit: Payer: Self-pay

## 2023-01-09 ENCOUNTER — Encounter: Payer: Self-pay | Admitting: Allergy & Immunology

## 2023-01-09 VITALS — BP 125/82 | HR 72 | Temp 97.9°F | Resp 20 | Ht 65.0 in | Wt 180.0 lb

## 2023-01-09 DIAGNOSIS — K219 Gastro-esophageal reflux disease without esophagitis: Secondary | ICD-10-CM | POA: Diagnosis not present

## 2023-01-09 DIAGNOSIS — G4733 Obstructive sleep apnea (adult) (pediatric): Secondary | ICD-10-CM

## 2023-01-09 DIAGNOSIS — R490 Dysphonia: Secondary | ICD-10-CM

## 2023-01-09 DIAGNOSIS — D4709 Other mast cell neoplasms of uncertain behavior: Secondary | ICD-10-CM | POA: Diagnosis not present

## 2023-01-09 DIAGNOSIS — J3089 Other allergic rhinitis: Secondary | ICD-10-CM

## 2023-01-09 DIAGNOSIS — J4540 Moderate persistent asthma, uncomplicated: Secondary | ICD-10-CM | POA: Diagnosis not present

## 2023-01-09 MED ORDER — FEXOFENADINE HCL 180 MG PO TABS: 180.0000 mg | ORAL_TABLET | Freq: Two times a day (BID) | ORAL | 1 refills | Status: DC

## 2023-01-09 MED ORDER — PANTOPRAZOLE SODIUM 40 MG PO TBEC: 40.0000 mg | DELAYED_RELEASE_TABLET | Freq: Two times a day (BID) | ORAL | 5 refills | Status: DC

## 2023-01-09 MED ORDER — BREZTRI AEROSPHERE 160-9-4.8 MCG/ACT IN AERO: 2.0000 | INHALATION_SPRAY | Freq: Two times a day (BID) | RESPIRATORY_TRACT | 5 refills | Status: DC

## 2023-01-09 MED ORDER — IPRATROPIUM BROMIDE 0.03 % NA SOLN: 2.0000 | Freq: Two times a day (BID) | NASAL | 5 refills | Status: DC

## 2023-01-09 NOTE — Progress Notes (Signed)
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 Shearon Stalls)   Perennial allergic rhinitis (dust mites, cockroach)   GERD - increasing pantoprazole to BID dosing  Plan/Recommendations:   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  - Continue with Allegra twice daily.  2. SOB (shortness of breath) - Lung testing looks great today.  - I think you are on the right track with your inhaler. - TRY increasing the pantoprazole to TWICE DAILY to see if this helps with the phlegm production in the mornings. - Call us with an update in two weeks.  - Daily controller medication(s): Breztri two puffs twice daily with spacer - Prior to physical activity: albuterol 2 puffs 10-15 minutes before physical activity. - Rescue medications: albuterol 4 puffs every 4-6 hours as needed - Asthma control goals:  * Full participation in all desired activities (may need albuterol before activity) * Albuterol use two time or less a week on average (not counting use with activity) * Cough interfering with sleep two time or less a month * Oral steroids no more than once a year * No hospitalizations  3. Perennial allergic rhinitis (dust mites, cockroach) - Continue with Allegra twice daily as you are doing.  - Stop your current nose sprays.  - Start ipratropium one spray per nostril twice daily.   4. Return in about 3 months (around 04/09/2023). I WANT AN UPDATE IN TWO WEEKS!    Subjective:   Joshua Haney is a 67 y.o. male presenting today for follow up of  Chief Complaint  Patient presents with   mastocytosis    24mf/u Xolair injections q 4 wks    Shortness of Breath    Breztri, Albeterol    Allergic Rhitinitis    Allegra bid needs 3 month supply refills, Astelin nasal spray    Joshua MACARIhas a history of the following: Patient Active Problem List   Diagnosis Date Noted   OSA (obstructive sleep apnea) 07/03/2021   Moderate persistent asthma without complication [id number]  Mastocytosis 05/22/2021   Perennial allergic rhinitis 05/22/2021   SOB (shortness of breath) 05/22/2021    History obtained from: chart review and patient and wife .  Joshua Haney a 67y.o. male presenting for a follow up visit. He was last seen in August 2023. At that time, he was doing very well on Xolair every four weeks. We also continued with Allegra but decreased this to once daily to see if this helped with the hoarseness and dry throat. For his shortness of breath, we continued with Breztri two puffs twice daily with a spacer. We continued with albuterol as needed. For his allergic rhinitis, he was well controlled with the Astelin and the Allegra BID.   Since the last visit, he has done very well.   He has not had any reactions at all since starting the Xolair. This was before he saw me. It seemed to work better with the one Allegra daily. He did have some kidney issues when he was on fenofibrate. This resolved with the cessation of the fenofibrate. This was started by Cardiology but they never replaced it with anything at all.  Asthma/Respiratory Symptom History: He is on the [place] two puffs twice daily. He does have some hoarseness from the CPAP.  He did decrease the Allegra, but that did not help with the hoarseness and his rash became more prevalent. He also felt that his breathing got a bit worse with the Allegra once daily. Therefore, he increased it back to BID.   Allergic Rhinitis Symptom History: He is still having some phlegm in the morning. He is blow his nose all of the time in the morning.  He is on azleastine one spray in the morning and Flonase one spray per nostril at night time.    GERD Symptom History: He is already on Protonix daily for GERD.  He is open to increasing it to twice daily.   Otherwise, there have been no changes to his past medical history, surgical history, family history, or social history.    Review of Systems  Constitutional: Negative.  Negative for chills, fever, malaise/fatigue and weight loss.  HENT: Negative.  Negative for congestion, ear discharge, ear pain and sinus pain.   Eyes:  Negative for pain, discharge and redness.  Respiratory:  Positive for shortness of breath. Negative for cough, sputum production, wheezing and stridor.   Cardiovascular: Negative.  Negative for chest pain and palpitations.  Gastrointestinal:  Negative for abdominal pain, constipation, diarrhea, heartburn, nausea and vomiting.  Skin:  Positive for itching and rash.  Neurological:  Negative for dizziness and headaches.  Endo/Heme/Allergies:  Negative for environmental allergies. Does not bruise/bleed easily.       Objective:   Blood pressure 125/82, pulse 72, temperature 97.9 F (36.6 C), resp. rate 20, height 5' 5"$  (1.651 m), weight 180 lb (81.6 kg), SpO2 97 %. Body mass index is 29.95 kg/m.    Physical Exam Vitals reviewed.  Constitutional:      Appearance: He is well-developed.     Comments: Very pleasant male.  Hard of hearing, but this seems better today compared to previous visits. He is smiling and personable.   HENT:     Head: Normocephalic and atraumatic.     Right Ear: Tympanic membrane, ear canal and external ear normal.     Left Ear: Tympanic membrane, ear canal and external ear normal.     Nose: No nasal deformity, septal deviation, mucosal edema or rhinorrhea.     Right Turbinates: Enlarged, swollen and pale.     Left Turbinates: Enlarged, swollen and pale.     Right Sinus: No maxillary sinus tenderness or frontal sinus tenderness.     Left Sinus: No maxillary sinus tenderness or frontal sinus tenderness.     Mouth/Throat:      Mouth: Mucous membranes are not pale and not dry.     Pharynx: Uvula midline.     Comments: Throat seems well-hydrated today.  Oropharynx without cobblestoning. Eyes:     General: Lids are normal. No allergic shiner.       Right eye: No discharge.        Left eye: No discharge.     Conjunctiva/sclera: Conjunctivae normal.     Right eye: Right conjunctiva is not injected. No chemosis.    Left eye: Left conjunctiva is not injected. No chemosis.    Pupils: Pupils are equal, round, and reactive to light.  Cardiovascular:     Rate and Rhythm: Normal rate and regular rhythm.     Heart sounds: Normal heart sounds.  Pulmonary:     Effort: Pulmonary effort is normal. No tachypnea, accessory  muscle usage or respiratory distress.     Breath sounds: Normal breath sounds. No wheezing, rhonchi or rales.     Comments: Hoarse.  Moving air well in all lung fields.  No increased work of breathing. Chest:     Chest wall: No tenderness.  Lymphadenopathy:     Cervical: No cervical adenopathy.  Skin:    General: Skin is warm.     Capillary Refill: Capillary refill takes less than 2 seconds.     Coloration: Skin is not pale.     Findings: No abrasion, erythema, petechiae or rash. Rash is not papular, urticarial or vesicular.     Comments: No eczematous or urticarial lesions noted.  No excoriations.  Neurological:     Mental Status: He is alert.  Psychiatric:        Behavior: Behavior is cooperative.      Diagnostic studies: none        Salvatore Marvel, MD  [facility]

## 2023-01-09 NOTE — Patient Instructions (Addendum)
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  - Continue with Allegra twice daily.  2. SOB (shortness of breath) - Lung testing looks great today.  - I think you are on the right track with your inhaler. - TRY increasing the pantoprazole to TWICE DAILY to see if this helps with the phlegm production in the mornings. - Call us with an update in two weeks.  - Daily controller medication(s): Breztri two puffs twice daily with spacer - Prior to physical activity: albuterol 2 puffs 10-15 minutes before physical activity. - Rescue medications: albuterol 4 puffs every 4-6 hours as needed - Asthma control goals:  * Full participation in all desired activities (may need albuterol before activity) * Albuterol use two time or less a week on average (not counting use with activity) * Cough interfering with sleep two time or less a month * Oral steroids no more than once a year * No hospitalizations  3. Perennial allergic rhinitis (dust mites, cockroach) - Continue with Allegra twice daily as you are doing.  - Stop your current nose sprays.  - Start ipratropium one spray per nostril twice daily.   4. Return in about 3 months (around 04/09/2023). I WANT AN UPDATE IN TWO WEEKS!    Please inform us of any Emergency Department visits, hospitalizations, or changes in symptoms. Call us before going to the ED for breathing or allergy symptoms since we might be able to fit you in for a sick visit. Feel free to contact us anytime with any questions, problems, or concerns.  It was a pleasure to see you and your family again today!  Websites that have reliable patient information: 1. American Academy of Asthma, Allergy, and Immunology: www.aaaai.org 2. Food Allergy Research and Education (FARE): foodallergy.org 3. Mothers of Asthmatics: http://www.asthmacommunitynetwork.org 4. American College of Allergy, Asthma, and Immunology: www.acaai.org   COVID-19 Vaccine Information  can be found at: [email or web address].uk For questions related to vaccine distribution or appointments, please email [email or web address] or call [phone].   We realize that you might be concerned about having an allergic reaction to the COVID19 vaccines. To help with that concern, WE ARE OFFERING THE COVID19 VACCINES IN OUR OFFICE! Ask the front desk for dates!     "Like" [place] on Facebook and Instagram for our latest updates!      A healthy democracy works best when [employer] participate! Make sure you are registered to vote! If you have moved or changed any of your contact information, you will need to get this updated before voting!  In some cases, you MAY be able to register to vote online: [email or web address]

## 2023-01-23 ENCOUNTER — Telehealth: Payer: Self-pay | Admitting: Allergy & Immunology

## 2023-01-23 MED ORDER — IPRATROPIUM BROMIDE 0.03 % NA SOLN: 2.0000 | Freq: Two times a day (BID) | NASAL | 1 refills | Status: DC

## 2023-01-23 NOTE — Telephone Encounter (Signed)
Great - thanks so much for letting me know!   Salvatore Marvel, MD [facility]

## 2023-01-23 NOTE — Telephone Encounter (Signed)
I called the patient and he hung up on me. I called back and left a message that nasal spray presciption has been changed to a 3 month supply to the pharmacy listed in previous note.

## 2023-01-23 NOTE — Addendum Note (Signed)
Addended by: Larence Penning on: 01/23/2023 10:53 AM   Modules accepted: Orders

## 2023-01-23 NOTE — Telephone Encounter (Signed)
Patient's wife called to let Dr. Ernst Bowler know that the ipratropium nasal spray is working well for patient. Patient is feeling better.   Patient is requesting a 3 month supply for the ipratropium  [facility] - [address] [address]  Best contact number: [phone]

## 2023-01-27 ENCOUNTER — Ambulatory Visit: Payer: Medicare Other

## 2023-01-28 ENCOUNTER — Ambulatory Visit: Payer: Medicare Other

## 2023-01-28 DIAGNOSIS — J4540 Moderate persistent asthma, uncomplicated: Secondary | ICD-10-CM | POA: Diagnosis not present

## 2023-02-19 DIAGNOSIS — L989 Disorder of the skin and subcutaneous tissue, unspecified: Secondary | ICD-10-CM | POA: Diagnosis not present

## 2023-02-19 DIAGNOSIS — N1831 Chronic kidney disease, stage 3a: Secondary | ICD-10-CM | POA: Diagnosis not present

## 2023-02-19 DIAGNOSIS — Z139 Encounter for screening, unspecified: Secondary | ICD-10-CM | POA: Diagnosis not present

## 2023-02-19 DIAGNOSIS — K219 Gastro-esophageal reflux disease without esophagitis: Secondary | ICD-10-CM | POA: Diagnosis not present

## 2023-02-25 ENCOUNTER — Ambulatory Visit: Payer: Medicare Other

## 2023-02-25 DIAGNOSIS — J4540 Moderate persistent asthma, uncomplicated: Secondary | ICD-10-CM

## 2023-03-05 DIAGNOSIS — L720 Epidermal cyst: Secondary | ICD-10-CM | POA: Diagnosis not present

## 2023-03-05 DIAGNOSIS — L821 Other seborrheic keratosis: Secondary | ICD-10-CM | POA: Diagnosis not present

## 2023-03-05 DIAGNOSIS — D225 Melanocytic nevi of trunk: Secondary | ICD-10-CM | POA: Diagnosis not present

## 2023-03-05 DIAGNOSIS — L570 Actinic keratosis: Secondary | ICD-10-CM | POA: Diagnosis not present

## 2023-03-25 ENCOUNTER — Ambulatory Visit: Payer: Medicare Other | Admitting: *Deleted

## 2023-03-25 DIAGNOSIS — J4540 Moderate persistent asthma, uncomplicated: Secondary | ICD-10-CM

## 2023-04-10 ENCOUNTER — Ambulatory Visit: Payer: Medicare Other | Admitting: Allergy & Immunology

## 2023-04-10 ENCOUNTER — Encounter: Payer: Self-pay | Admitting: Allergy & Immunology

## 2023-04-10 ENCOUNTER — Other Ambulatory Visit: Payer: Self-pay

## 2023-04-10 VITALS — BP 124/78 | HR 68 | Temp 98.3°F | Ht 65.35 in | Wt 182.0 lb

## 2023-04-10 DIAGNOSIS — D4709 Other mast cell neoplasms of uncertain behavior: Secondary | ICD-10-CM

## 2023-04-10 DIAGNOSIS — J4540 Moderate persistent asthma, uncomplicated: Secondary | ICD-10-CM

## 2023-04-10 DIAGNOSIS — J3089 Other allergic rhinitis: Secondary | ICD-10-CM | POA: Diagnosis not present

## 2023-04-10 DIAGNOSIS — R490 Dysphonia: Secondary | ICD-10-CM

## 2023-04-10 DIAGNOSIS — K219 Gastro-esophageal reflux disease without esophagitis: Secondary | ICD-10-CM

## 2023-04-10 MED ORDER — CLOBETASOL PROPIONATE 0.05 % EX OINT: 1.0000 | TOPICAL_OINTMENT | Freq: Two times a day (BID) | CUTANEOUS | 5 refills | Status: DC | PRN

## 2023-04-10 MED ORDER — LEVOCETIRIZINE DIHYDROCHLORIDE 5 MG PO TABS: 5.0000 mg | ORAL_TABLET | Freq: Two times a day (BID) | ORAL | 3 refills | Status: DC

## 2023-04-10 NOTE — Progress Notes (Signed)
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 Celine Mans)   Perennial allergic rhinitis (dust mites, cockroach)   GERD - increasing pantoprazole to BID dosing  Plan/Recommendations:   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  - Continue with Allegra twice daily and then transition to Xyzal twice daily. - Start clobetasol 2-3 times daily for a week with tick bites.  - Discussed Aykavit, but since he is stable on Xolair we are going to continue with this.   2. SOB (shortness of breath) - Lung testing looks phenomenal today.  - Daily controller medication(s): Breztri two puffs twice daily with spacer - Prior to physical activity: albuterol 2 puffs 10-15 minutes before physical activity. - Rescue medications: albuterol 4 puffs every 4-6 hours as needed - Asthma control goals:  * Full participation in all desired activities (may need albuterol before activity) * Albuterol use two time or less a week on average (not counting use with activity) * Cough interfering with sleep two time or less a month * Oral steroids no more than once a year * No hospitalizations  3. Perennial allergic rhinitis (dust mites, cockroach) - Finish Allegra and start Xyzal twice daily (I sent in a 90 day prescription).  - Continue ipratropium one spray per nostril twice daily.   4. GERD - Continue with Protonix twice daily   5. Return in about 6 months (around 10/11/2023).   Subjective:   Joshua Haney is a 67 y.o. male presenting today for follow up of  Chief Complaint  Patient presents with   Asthma   Follow-up    Joshua Haney has a history of the following: Patient Active Problem List    Diagnosis Date Noted   OSA (obstructive sleep apnea) 07/03/2021   Moderate persistent asthma without complication 07/03/2021   Mastocytosis 05/22/2021   Perennial allergic rhinitis 05/22/2021   SOB (shortness of breath) 05/22/2021    History obtained from: chart review and patient.  Joshua Haney is a 67 y.o. male presenting for a follow up visit.  He was last seen in February 2024.  At that time, we continue with Xolair every 4 weeks as well as Allegra twice daily.  For his shortness of breath, his lung testing looked great.  We recommended increasing his pantoprazole to twice daily to see if that help with the phlegm production.  We continued with Breztri 2 puffs twice daily as well as albuterol as needed.  For his rhinitis, we continue with Allegra twice daily and started ipratropium.  Since last visit, he has done well. He said that he has seen my commercial on TV and he needs my autograph!   Asthma/Respiratory Symptom History: He is doing very well with his breathing. He remains on the Breztri two puffs BID. This seems to be working to control his breathing very effectively. He has not been on prednisone at all for his symptoms. He has not been to the ED for his breathing at all. He does report improvement with his symptoms since starting the Xolair (which was added predominantly to prevent anaphylaxis episodes from his mastocytosis).   Allergic Rhinitis Symptom History: He is not blowing his nose as much as  he was previously. Phlegm production is much better. The ipratropium has been working well to control his symptoms. He has not been on antibiotics at all for his rhinitis symptoms. His wife thinks that he is doing much better and the hoarseness has markedly improved with this addition. They are very happy with how well he is doing.   Skin Symptom History: He has an issue with marked reaction to ticks. He does not notice them initially.  But then he will  notice the itching of his skin and  remove them. He denies any fevers or joint pains or stereotypical rashes with these bites consistent with Lyme disease. He has never been treated for Lyme disease.   GERD Symptom History: He has done well with Protonix BID. This is working very well.  He reports that the hoarseness has improved with the PPI as well as the new nasal spray.   Otherwise, there have been no changes to his past medical history, surgical history, family history, or social history.    Review of Systems  Constitutional: Negative.  Negative for chills, fever, malaise/fatigue and weight loss.  HENT: Negative.  Negative for congestion, ear discharge, ear pain and sinus pain.   Eyes:  Negative for pain, discharge and redness.  Respiratory:  Negative for cough, sputum production, shortness of breath, wheezing and stridor.   Cardiovascular: Negative.  Negative for chest pain and palpitations.  Gastrointestinal:  Negative for abdominal pain, constipation, diarrhea, heartburn, nausea and vomiting.  Skin:  Negative for itching and rash.  Neurological:  Negative for dizziness and headaches.  Endo/Heme/Allergies:  Positive for environmental allergies. Does not bruise/bleed easily.       Objective:   Blood pressure 124/78, pulse 68, temperature 98.3 F (36.8 C), temperature source Temporal, height 5' 5.35" (1.66 m), weight 182 lb (82.6 kg), SpO2 97 %. Body mass index is 29.96 kg/m.    Physical Exam Vitals reviewed.  Constitutional:      Appearance: He is well-developed.     Comments: Very pleasant male.  Hard of hearing as per his usual. Joyful.   HENT:     Head: Normocephalic and atraumatic.     Right Ear: Tympanic membrane, ear canal and external ear normal.     Left Ear: Tympanic membrane, ear canal and external ear normal.     Nose: No nasal deformity, septal deviation, mucosal edema or rhinorrhea.     Right Turbinates: Enlarged, swollen and pale.     Left Turbinates: Enlarged, swollen and pale.     Right  Sinus: No maxillary sinus tenderness or frontal sinus tenderness.     Left Sinus: No maxillary sinus tenderness or frontal sinus tenderness.     Comments: No nasal polyps noted.     Mouth/Throat:     Mouth: Mucous membranes are not pale and not dry.     Pharynx: Uvula midline.     Comments: Throat seems well-hydrated today.  Oropharynx without cobblestoning. Eyes:     General: Lids are normal. No allergic shiner.       Right eye: No discharge.        Left eye: No discharge.     Conjunctiva/sclera: Conjunctivae normal.     Right eye: Right conjunctiva is not injected. No chemosis.    Left eye: Left conjunctiva is not injected. No chemosis.    Pupils: Pupils are equal, round, and reactive to light.  Cardiovascular:     Rate and Rhythm: Normal rate and regular rhythm.  Heart sounds: Normal heart sounds.  Pulmonary:     Effort: Pulmonary effort is normal. No tachypnea, accessory muscle usage or respiratory distress.     Breath sounds: Normal breath sounds. No wheezing, rhonchi or rales.     Comments: Hoarse.  Moving air well in all lung fields.  No increased work of breathing. Chest:     Chest wall: No tenderness.  Lymphadenopathy:     Cervical: No cervical adenopathy.  Skin:    General: Skin is warm.     Capillary Refill: Capillary refill takes less than 2 seconds.     Coloration: Skin is not pale.     Findings: No abrasion, erythema, petechiae or rash. Rash is not papular, urticarial or vesicular.     Comments: No eczematous or urticarial lesions noted.  No excoriations.  Neurological:     Mental Status: He is alert.  Psychiatric:        Behavior: Behavior is cooperative.      Diagnostic studies:    Spirometry: results normal (FEV1: 2.80/98%, FVC: 3.37/90%, FEV1/FVC: 83%).    Spirometry consistent with normal pattern.   Allergy Studies: none       Malachi Bonds, MD  [facility]

## 2023-04-10 NOTE — Patient Instructions (Addendum)
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  - Continue with Allegra twice daily and then transition to Xyzal twice daily. - Start clobetasol 2-3 times daily for a week with tick bites.   2. SOB (shortness of breath) - Lung testing looks phenomenal today.  - Daily controller medication(s): Breztri two puffs twice daily with spacer - Prior to physical activity: albuterol 2 puffs 10-15 minutes before physical activity. - Rescue medications: albuterol 4 puffs every 4-6 hours as needed - Asthma control goals:  * Full participation in all desired activities (may need albuterol before activity) * Albuterol use two time or less a week on average (not counting use with activity) * Cough interfering with sleep two time or less a month * Oral steroids no more than once a year * No hospitalizations  3. Perennial allergic rhinitis (dust mites, cockroach) - Finish Allegra and start Xyzal twice daily (I sent in a 90 day prescription).  - Continue ipratropium one spray per nostril twice daily.   4. GERD - Continue with Protonix twice daily   5. Return in about 6 months (around 10/11/2023).    Please inform us of any Emergency Department visits, hospitalizations, or changes in symptoms. Call us before going to the ED for breathing or allergy symptoms since we might be able to fit you in for a sick visit. Feel free to contact us anytime with any questions, problems, or concerns.  It was a pleasure to see you and your family again today!  Websites that have reliable patient information: 1. American Academy of Asthma, Allergy, and Immunology: www.aaaai.org 2. Food Allergy Research and Education (FARE): foodallergy.org 3. Mothers of Asthmatics: http://www.asthmacommunitynetwork.org 4. American College of Allergy, Asthma, and Immunology: www.acaai.org   COVID-19 Vaccine Information can be found at:  [email or web address] For questions related to vaccine distribution or appointments, please email [email or web address] or call [phone].   We realize that you might be concerned about having an allergic reaction to the COVID19 vaccines. To help with that concern, WE ARE OFFERING THE COVID19 VACCINES IN OUR OFFICE! Ask the front desk for dates!     "Like" [place] on Facebook and Instagram for our latest updates!      A healthy democracy works best when [employer] participate! Make sure you are registered to vote! If you have moved or changed any of your contact information, you will need to get this updated before voting!  In some cases, you MAY be able to register to vote online: [email or web address]

## 2023-04-14 ENCOUNTER — Encounter: Payer: Self-pay | Admitting: Allergy & Immunology

## 2023-04-14 MED ORDER — IPRATROPIUM BROMIDE 0.03 % NA SOLN: 2.0000 | Freq: Two times a day (BID) | NASAL | 1 refills | Status: DC

## 2023-04-14 MED ORDER — LEVOCETIRIZINE DIHYDROCHLORIDE 5 MG PO TABS: 5.0000 mg | ORAL_TABLET | Freq: Two times a day (BID) | ORAL | 1 refills | Status: DC

## 2023-04-14 MED ORDER — BREZTRI AEROSPHERE 160-9-4.8 MCG/ACT IN AERO: 2.0000 | INHALATION_SPRAY | Freq: Two times a day (BID) | RESPIRATORY_TRACT | 5 refills | Status: DC

## 2023-04-14 MED ORDER — PANTOPRAZOLE SODIUM 40 MG PO TBEC: 40.0000 mg | DELAYED_RELEASE_TABLET | Freq: Two times a day (BID) | ORAL | 5 refills | Status: DC

## 2023-04-22 ENCOUNTER — Ambulatory Visit: Payer: Medicare Other | Admitting: *Deleted

## 2023-04-22 DIAGNOSIS — J4540 Moderate persistent asthma, uncomplicated: Secondary | ICD-10-CM | POA: Diagnosis not present

## 2023-05-12 DIAGNOSIS — H25813 Combined forms of age-related cataract, bilateral: Secondary | ICD-10-CM | POA: Diagnosis not present

## 2023-05-15 DIAGNOSIS — N1831 Chronic kidney disease, stage 3a: Secondary | ICD-10-CM | POA: Diagnosis not present

## 2023-05-15 DIAGNOSIS — M6208 Separation of muscle (nontraumatic), other site: Secondary | ICD-10-CM | POA: Diagnosis not present

## 2023-05-15 DIAGNOSIS — E7800 Pure hypercholesterolemia, unspecified: Secondary | ICD-10-CM | POA: Diagnosis not present

## 2023-05-15 DIAGNOSIS — E039 Hypothyroidism, unspecified: Secondary | ICD-10-CM | POA: Diagnosis not present

## 2023-05-20 ENCOUNTER — Ambulatory Visit: Payer: Medicare Other | Admitting: *Deleted

## 2023-05-20 DIAGNOSIS — J4540 Moderate persistent asthma, uncomplicated: Secondary | ICD-10-CM | POA: Diagnosis not present

## 2023-06-17 ENCOUNTER — Ambulatory Visit: Payer: Medicare Other | Admitting: *Deleted

## 2023-06-17 DIAGNOSIS — J4540 Moderate persistent asthma, uncomplicated: Secondary | ICD-10-CM | POA: Diagnosis not present

## 2023-07-02 ENCOUNTER — Other Ambulatory Visit: Payer: Self-pay | Admitting: Allergy & Immunology

## 2023-07-07 ENCOUNTER — Other Ambulatory Visit: Payer: Self-pay | Admitting: Allergy & Immunology

## 2023-07-09 ENCOUNTER — Telehealth: Payer: Self-pay | Admitting: Allergy & Immunology

## 2023-07-09 MED ORDER — LEVOCETIRIZINE DIHYDROCHLORIDE 5 MG PO TABS: 5.0000 mg | ORAL_TABLET | Freq: Two times a day (BID) | ORAL | 0 refills | Status: DC

## 2023-07-09 NOTE — Addendum Note (Signed)
Addended by: Rolland Bimler D on: 07/09/2023 05:28 PM   Modules accepted: Orders

## 2023-07-09 NOTE — Telephone Encounter (Signed)
Patient spouse states the levocitirizine that was called in should be 2x daily and it says 1x daily, they have already picked it up from the pharmacy but would like a call back if they need to go get more.

## 2023-07-09 NOTE — Telephone Encounter (Signed)
Spoke to patient's wife about resending the prescription to state twice a day as a 90 day supply.

## 2023-07-15 ENCOUNTER — Ambulatory Visit: Payer: Medicare Other | Admitting: *Deleted

## 2023-07-15 DIAGNOSIS — J4540 Moderate persistent asthma, uncomplicated: Secondary | ICD-10-CM

## 2023-08-12 ENCOUNTER — Ambulatory Visit: Payer: Medicare Other | Admitting: *Deleted

## 2023-08-12 DIAGNOSIS — J4540 Moderate persistent asthma, uncomplicated: Secondary | ICD-10-CM | POA: Diagnosis not present

## 2023-08-21 ENCOUNTER — Encounter: Payer: Self-pay | Admitting: Family Medicine

## 2023-08-21 ENCOUNTER — Other Ambulatory Visit: Payer: Self-pay

## 2023-08-21 ENCOUNTER — Ambulatory Visit: Payer: Medicare Other | Admitting: Family Medicine

## 2023-08-21 VITALS — BP 114/60 | HR 82 | Temp 97.9°F | Resp 20 | Wt 182.9 lb

## 2023-08-21 DIAGNOSIS — L501 Idiopathic urticaria: Secondary | ICD-10-CM

## 2023-08-21 DIAGNOSIS — J3089 Other allergic rhinitis: Secondary | ICD-10-CM | POA: Diagnosis not present

## 2023-08-21 DIAGNOSIS — J4540 Moderate persistent asthma, uncomplicated: Secondary | ICD-10-CM

## 2023-08-21 DIAGNOSIS — K219 Gastro-esophageal reflux disease without esophagitis: Secondary | ICD-10-CM | POA: Diagnosis not present

## 2023-08-21 DIAGNOSIS — D4709 Other mast cell neoplasms of uncertain behavior: Secondary | ICD-10-CM | POA: Diagnosis not present

## 2023-08-21 MED ORDER — FAMOTIDINE 20 MG PO TABS: 20.0000 mg | ORAL_TABLET | Freq: Two times a day (BID) | ORAL | 3 refills | Status: DC | PRN

## 2023-08-21 NOTE — Progress Notes (Signed)
[address] [place] [address] Dept: [phone]  FOLLOW UP NOTE  Patient ID: Joshua Haney, male    DOB: 12-05-1955  Age: 68 y.o. MRN: [id number] Date of Office Visit: 08/21/2023  Assessment  Chief Complaint: Rash (2 to 3 weeks now. Itchy. Multiple tick bites.)  HPI Joshua Haney is a 67 year old male who presents to the clinic for evaluation of hives and insect bites.  He was last seen in this clinic on 04/10/2023 by Dr. Dellis Anes for evaluation of mastocytosis, asthma, allergic rhinitis, reflux, and obstructive sleep apnea on CPAP.  He is accompanied by his wife who assists with history.  At today's visit, he reports that about 3 weeks ago he had multiple ticks on bilateral lower extremities.  He began to experience raised red itchy areas at each tick bite for which he began using Benadryl cream with some relief of itching.  He reports that over the last several weeks he started to experience raised red areas on both arms and his left shoulder for which he continues Benadryl cream with mild relief of symptoms.  His last tryptase was 3.8 on 05/22/2021.  C-kit mutation testing final result from 01/15/2017 noted no c-kit mutation identified.  Asthma is reported as well controlled with no shortness of breath, cough, or wheeze with activity or rest.  He continues Breztri 2 puffs twice a day with a spacer and rarely needs to use albuterol for relief of symptoms.  Allergic rhinitis is reported as moderately well-controlled with occasional symptoms including rhinorrhea and nasal congestion.  He continues levocetirizine once a day and is not currently using nasal saline rinses or ipratropium.  Reflux is reported as well-controlled with no symptoms including heartburn or vomiting.  He continues pantoprazole twice a day with relief of symptoms.  His current medications are listed in the chart.  Drug Allergies:  No Known Allergies  Physical Exam: BP 114/60   Pulse 82   Temp 97.9 F (36.6  C) (Temporal)   Resp 20   Wt 182 lb 14.4 oz (83 kg)   SpO2 97%   BMI 30.11 kg/m    Physical Exam        Assessment and Plan: 1. Mastocytosis   2. Moderate persistent asthma without complication   3. Perennial allergic rhinitis   4. Gastroesophageal reflux disease, unspecified whether esophagitis present   5. Idiopathic urticaria     Meds ordered this encounter  Medications   famotidine (PEPCID) 20 MG tablet    Sig: Take 1 tablet (20 mg total) by mouth 2 (two) times daily as needed for heartburn or indigestion.    Dispense:  60 tablet    Refill:  3    Patient Instructions  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  - Continue Xyzal twice daily. - Start clobetasol 2-3 times daily for a week with tick bites.  - We are checking some lab work today. We will call you when the results become available  2. SOB (shortness of breath) - Daily controller medication(s): Breztri two puffs twice daily with spacer - Prior to physical activity: albuterol 2 puffs 10-15 minutes before physical activity. - Rescue medications: albuterol 4 puffs every 4-6 hours as needed - Asthma control goals:  * Full participation in all desired activities (may need albuterol before activity) * Albuterol use two time or less a week on average (not counting use with activity) * Cough interfering with sleep two time or less  a month * Oral steroids no more than once a year * No hospitalizations  3. Perennial allergic rhinitis (dust mites, cockroach) - Continue Xyzal twice daily (I sent in a 90 day prescription).  - Continue ipratropium one spray per nostril twice daily.   4. GERD - Continue with Protonix twice daily   5. Hives - Take the least amount of medications while remaining hive free Levocetirizine (Xyzal) 5 mg twice a day and famotidine (Pepcid) 20 mg twice a day. If no symptoms for 7-14 days then decrease to. Levocetirizine (Xyzal) 5 mg twice a day and  famotidine (Pepcid) 20 mg once a day.  If no symptoms for 7-14 days then decrease to. Levocetirizine (Xyzal) 5 mg twice a day.  If no symptoms for 7-14 days then decrease to. Levocetirizine (Xyzal) 5 mg once a day. No follow-ups on file.   6. Follow up at your already scheduled appointment on 10/14/2023 with Dr. Dellis Anes    Return in about 8 weeks (around 10/14/2023), or if symptoms worsen or fail to improve.    Thank you for the opportunity to care for this patient.  Please do not hesitate to contact me with questions.  Thermon Leyland, FNP [facility]

## 2023-08-21 NOTE — Patient Instructions (Addendum)
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  - Continue Xyzal twice daily. - Start clobetasol 2-3 times daily for a week with tick bites.  - We are checking some lab work today. We will call you when the results become available  2. SOB (shortness of breath) - Daily controller medication(s): Breztri two puffs twice daily with spacer - Prior to physical activity: albuterol 2 puffs 10-15 minutes before physical activity. - Rescue medications: albuterol 4 puffs every 4-6 hours as needed - Asthma control goals:  * Full participation in all desired activities (may need albuterol before activity) * Albuterol use two time or less a week on average (not counting use with activity) * Cough interfering with sleep two time or less a month * Oral steroids no more than once a year * No hospitalizations  3. Perennial allergic rhinitis (dust mites, cockroach) - Continue Xyzal twice daily (I sent in a 90 day prescription).  - Continue ipratropium one spray per nostril twice daily.   4. GERD - Continue with Protonix twice daily   5. Hives - Take the least amount of medications while remaining hive free Levocetirizine (Xyzal) 5 mg twice a day and famotidine (Pepcid) 20 mg twice a day. If no symptoms for 7-14 days then decrease to. Levocetirizine (Xyzal) 5 mg twice a day and famotidine (Pepcid) 20 mg once a day.  If no symptoms for 7-14 days then decrease to. Levocetirizine (Xyzal) 5 mg twice a day.  If no symptoms for 7-14 days then decrease to. Levocetirizine (Xyzal) 5 mg once a day. No follow-ups on file.   6. Follow up at your already scheduled appointment on 10/14/2023 with Dr. Dellis Anes

## 2023-08-22 ENCOUNTER — Encounter: Payer: Self-pay | Admitting: Family Medicine

## 2023-08-22 DIAGNOSIS — K219 Gastro-esophageal reflux disease without esophagitis: Secondary | ICD-10-CM | POA: Insufficient documentation

## 2023-08-22 DIAGNOSIS — L501 Idiopathic urticaria: Secondary | ICD-10-CM | POA: Insufficient documentation

## 2023-08-29 LAB — KIT (D816V) DIGITAL PCR: CKIT Result: NEGATIVE

## 2023-08-29 LAB — TRYPTASE: Tryptase: 3.6 ug/L (ref 2.2–13.2)

## 2023-09-09 ENCOUNTER — Ambulatory Visit: Payer: Medicare Other

## 2023-09-09 DIAGNOSIS — J4540 Moderate persistent asthma, uncomplicated: Secondary | ICD-10-CM

## 2023-09-25 ENCOUNTER — Other Ambulatory Visit: Payer: Self-pay | Admitting: *Deleted

## 2023-09-25 MED ORDER — OMALIZUMAB 150 MG/ML ~~LOC~~ SOSY: 300.0000 mg | PREFILLED_SYRINGE | SUBCUTANEOUS | 11 refills | Status: AC

## 2023-10-05 ENCOUNTER — Other Ambulatory Visit: Payer: Self-pay | Admitting: Allergy & Immunology

## 2023-10-06 ENCOUNTER — Other Ambulatory Visit: Payer: Self-pay | Admitting: Allergy & Immunology

## 2023-10-06 ENCOUNTER — Other Ambulatory Visit: Payer: Self-pay | Admitting: Family

## 2023-10-06 ENCOUNTER — Other Ambulatory Visit: Payer: Self-pay | Admitting: Family Medicine

## 2023-10-07 ENCOUNTER — Ambulatory Visit: Payer: Medicare Other | Admitting: *Deleted

## 2023-10-07 DIAGNOSIS — J4540 Moderate persistent asthma, uncomplicated: Secondary | ICD-10-CM

## 2023-10-07 NOTE — Telephone Encounter (Signed)
I have not seen this patient. My guess would be that he could buy the second dose over the counter if needed. We will see what Thurston Hole says

## 2023-10-07 NOTE — Telephone Encounter (Signed)
Is there any chance we can PA this? Thank you

## 2023-10-08 ENCOUNTER — Other Ambulatory Visit (HOSPITAL_COMMUNITY): Payer: Self-pay

## 2023-10-08 ENCOUNTER — Telehealth: Payer: Self-pay

## 2023-10-08 ENCOUNTER — Encounter: Payer: Self-pay | Admitting: Allergy & Immunology

## 2023-10-08 NOTE — Telephone Encounter (Signed)
Pharmacy Patient Advocate Encounter   Received notification from RX Request Messages that prior authorization for Levocetirizine Dihydrochloride 5MG  tablets is required/requested.   Insurance verification completed.   The patient is insured through [facility] .   Per test claim: PA required; PA submitted to above mentioned insurance via CoverMyMeds Key/confirmation #/EOC [id number] Status is pending

## 2023-10-08 NOTE — Telephone Encounter (Signed)
PA request has been Submitted. New Encounter created for follow up. For additional info see Pharmacy Prior Auth telephone encounter from 10-08-2023.

## 2023-10-09 NOTE — Telephone Encounter (Signed)
Pharmacy Patient Advocate Encounter   Additional information has been requested from the patient's insurance in order to proceed with the prior authorization request. Requested information has been sent, or form has been filled out and faxed back to [phone]

## 2023-10-14 ENCOUNTER — Ambulatory Visit: Payer: Medicare Other | Admitting: Allergy & Immunology

## 2023-10-14 ENCOUNTER — Ambulatory Visit: Payer: Medicare Other | Admitting: Family Medicine

## 2023-10-14 ENCOUNTER — Encounter: Payer: Self-pay | Admitting: Family Medicine

## 2023-10-14 ENCOUNTER — Other Ambulatory Visit: Payer: Self-pay

## 2023-10-14 VITALS — BP 132/70 | HR 82 | Temp 98.0°F | Resp 16 | Wt 182.5 lb

## 2023-10-14 DIAGNOSIS — J3089 Other allergic rhinitis: Secondary | ICD-10-CM

## 2023-10-14 DIAGNOSIS — K219 Gastro-esophageal reflux disease without esophagitis: Secondary | ICD-10-CM | POA: Diagnosis not present

## 2023-10-14 DIAGNOSIS — D4709 Other mast cell neoplasms of uncertain behavior: Secondary | ICD-10-CM

## 2023-10-14 DIAGNOSIS — L501 Idiopathic urticaria: Secondary | ICD-10-CM | POA: Diagnosis not present

## 2023-10-14 DIAGNOSIS — J4540 Moderate persistent asthma, uncomplicated: Secondary | ICD-10-CM

## 2023-10-14 NOTE — Progress Notes (Signed)
[address] [place] [address] Dept: [phone]  FOLLOW UP NOTE  Patient ID: Joshua Haney, male    DOB: 04-24-1956  Age: 67 y.o. MRN: [id number] Date of Office Visit: 10/14/2023  Assessment  Chief Complaint: No chief complaint on file.  HPI Joshua Haney is a 67 year old male who presents to the clinic for follow-up visit.  He was last seen in this clinic on 08/21/2023 by Thermon Leyland, FNP, for evaluation of mastocytosis, asthma, allergic rhinitis, reflux, and urticaria. At that time, 08/21/2023, he was experiencing hives and had a tryptase that was noted to be within normal limits and negative C-kit. He is accompanied by his wife who assists with history.   At today's visit, he reports that his asthma has been moderately well-controlled with 1 episode of shortness of breath while he was fixing the porch and spent a lot of time bending over.  He used albuterol at that time with relief of symptoms.  Otherwise, he denies shortness of breath, cough, or wheeze with activity or rest.  He continues Breztri 2 puffs twice a day with a spacer and usually uses albuterol about once a month with relief of symptoms.  Allergic rhinitis is reported as moderately well-controlled with occasional clear rhinorrhea and nasal congestion occurring in the morning mainly.  He reports that he continues ipratropium with relief of symptoms.  He was previously taking Xyzal 5 mg twice a day, however, his insurance will only approve Xyzal once a day.  He has been using Xyzal 5 mg in addition to Allegra 180 mg once a day with good control of allergic rhinitis.  His last environmental allergy testing by lab was on 12/24/2016 and was positive to dust mite and cockroach.  He denies any episodes of urticaria since his last visit to this clinic.  He continues levocetirizine 5 mg once a day, Allegra 180 mg once a day, and famotidine twice a day.  He continues Xolair injections once every 4 weeks with relief of  symptoms.  Reflux is reported as well-controlled with no symptoms including heartburn or vomiting.  He continues pantoprazole twice a day with good control of symptoms.  The rash on his legs that was evident at his last visit to this clinic has mostly resolved.  He denies current itch.  He infrequently uses clobetasol on red and itchy areas occurring on his legs.  His current medications are listed in the chart.       Drug Allergies:  No Known Allergies  Physical Exam: BP 132/70   Pulse 82   Temp 98 F (36.7 C) (Temporal)   Resp 16   Wt 182 lb 8 oz (82.8 kg)   SpO2 99%   BMI 30.04 kg/m    Physical Exam Vitals reviewed.  Constitutional:      Appearance: Normal appearance.  HENT:     Head: Normocephalic and atraumatic.     Right Ear: Tympanic membrane normal.     Left Ear: Tympanic membrane normal.     Nose:     Comments: Bilateral nares slightly erythematous with thin clear nasal drainage noted.  Pharynx normal.  Ears normal.  Eyes normal.    Mouth/Throat:     Pharynx: Oropharynx is clear.  Eyes:     Conjunctiva/sclera: Conjunctivae normal.  Cardiovascular:     Rate and Rhythm: Normal rate and regular rhythm.     Heart sounds: Normal heart sounds. No murmur heard. Pulmonary:     Effort: Pulmonary effort is  normal.     Breath sounds: Normal breath sounds.     Comments: Lungs clear to auscultation Musculoskeletal:        General: Normal range of motion.     Cervical back: Normal range of motion and neck supple.  Skin:    General: Skin is warm and dry.     Comments: Scattered slightly erythematous patches noted on bilateral lower extremities.  No new areas noted since his last visit to this clinic.  Old scabbed areas visible at the last visit  appear to be healing well  Neurological:     Mental Status: He is alert and oriented to person, place, and time.  Psychiatric:        Mood and Affect: Mood normal.        Behavior: Behavior normal.        Thought Content:  Thought content normal.        Judgment: Judgment normal.     Diagnostics: FVC 3.30 which is 88% of predicted value, FEV1 2.86 which is 100% of predicted value.  Spirometry indicates normal ventilatory function.  Assessment and Plan: 1. Moderate persistent asthma without complication   2. Mastocytosis   3. Perennial allergic rhinitis   4. Gastroesophageal reflux disease, unspecified whether esophagitis present   5. Idiopathic urticaria     Patient Instructions  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  - Continue an antihistamine twice a day. Some examples of over the counter antihistamines include Zyrtec (cetirizine), Xyzal (levocetirizine), Allegra (fexofenadine), and Claritin (loratidine).   2. SOB (shortness of breath) - Daily controller medication(s): Breztri two puffs twice daily with spacer - Prior to physical activity: albuterol 2 puffs 10-15 minutes before physical activity. - Rescue medications: albuterol 4 puffs every 4-6 hours as needed - Asthma control goals:  * Full participation in all desired activities (may need albuterol before activity) * Albuterol use two time or less a week on average (not counting use with activity) * Cough interfering with sleep two time or less a month * Oral steroids no more than once a year * No hospitalizations  3. Perennial allergic rhinitis (dust mites, cockroach) - Continue an antihistamine 1-2 times a day as needed for a runny nose or itch - Continue ipratropium one spray per nostril twice daily for nasal symptoms - Consider saline nasal rinses as needed for nasal symptoms. Use this before any medicated nasal sprays for best result  4. GERD - Continue dietary and lifestyle modifications as listed below - Continue with Protonix twice daily for reflux control. Take this medication 30-60 minutes before a meal for best results  5. Hives - Take an antihistamine once or twice a day of needed for hives.  You may add famotidine 20 mg once a day as needed for a hive flare.  Continue clobetasol as needed for rash.  You may use clobetasol to red and itchy areas underneath your face up to twice a day.  Do not use this medication longer than 2 weeks in a row.  Call the clinic if this treatment plan is not working well for you  Follow up in 6 months or sooner if needed.   Return in about 6 months (around 04/12/2024), or if symptoms worsen or fail to improve.    Thank you for the opportunity to care for this patient.  Please do not hesitate to contact me with questions.  Thermon Leyland, FNP [facility]

## 2023-10-14 NOTE — Patient Instructions (Addendum)
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  - Continue an antihistamine twice a day. Some examples of over the counter antihistamines include Zyrtec (cetirizine), Xyzal (levocetirizine), Allegra (fexofenadine), and Claritin (loratidine).   2. SOB (shortness of breath) - Daily controller medication(s): Breztri two puffs twice daily with spacer - Prior to physical activity: albuterol 2 puffs 10-15 minutes before physical activity. - Rescue medications: albuterol 4 puffs every 4-6 hours as needed - Asthma control goals:  * Full participation in all desired activities (may need albuterol before activity) * Albuterol use two time or less a week on average (not counting use with activity) * Cough interfering with sleep two time or less a month * Oral steroids no more than once a year * No hospitalizations  3. Perennial allergic rhinitis (dust mites, cockroach) - Continue an antihistamine 1-2 times a day as needed for a runny nose or itch - Continue ipratropium one spray per nostril twice daily for nasal symptoms - Consider saline nasal rinses as needed for nasal symptoms. Use this before any medicated nasal sprays for best result  4. GERD - Continue dietary and lifestyle modifications as listed below - Continue with Protonix twice daily for reflux control. Take this medication 30-60 minutes before a meal for best results  5. Hives - Take an antihistamine once or twice a day of needed for hives. You may add famotidine 20 mg once a day as needed for a hive flare.  Continue clobetasol as needed for rash.  You may use clobetasol to red and itchy areas underneath your face up to twice a day.  Do not use this medication longer than 2 weeks in a row.  Call the clinic if this treatment plan is not working well for you  Follow up in 6 months or sooner if needed.   Control of Dust Mite Allergen Dust mites play a major role in allergic asthma and rhinitis. They occur in  environments with high humidity wherever human skin is found. Dust mites absorb humidity from the atmosphere (ie, they do not drink) and feed on organic matter (including shed human and animal skin). Dust mites are a microscopic type of insect that you cannot see with the naked eye. High levels of dust mites have been detected from mattresses, pillows, carpets, upholstered furniture, bed covers, clothes, soft toys and any woven material. The principal allergen of the dust mite is found in its feces. A gram of dust may contain 1,000 mites and 250,000 fecal particles. Mite antigen is easily measured in the air during house cleaning activities. Dust mites do not bite and do not cause harm to humans, other than by triggering allergies/asthma.  Ways to decrease your exposure to dust mites in your home:  1. Encase mattresses, box springs and pillows with a mite-impermeable barrier or cover  2. Wash sheets, blankets and drapes weekly in hot water (130 F) with detergent and dry them in a dryer on the hot setting.  3. Have the room cleaned frequently with a vacuum cleaner and a damp dust-mop. For carpeting or rugs, vacuuming with a vacuum cleaner equipped with a high-efficiency particulate air (HEPA) filter. The dust mite allergic individual should not be in a room which is being cleaned and should wait 1 hour after cleaning before going into the room.  4. Do not sleep on upholstered furniture (eg, couches).  5. If possible removing carpeting, upholstered furniture and drapery from the home is ideal. Horizontal blinds  should be eliminated in the rooms where the person spends the most time (bedroom, study, television room). Washable vinyl, roller-type shades are optimal.  6. Remove all non-washable stuffed toys from the bedroom. Wash stuffed toys weekly like sheets and blankets above.  7. Reduce indoor humidity to less than 50%. Inexpensive humidity monitors can be purchased at most hardware stores. Do not  use a humidifier as can make the problem worse and are not recommended.  Control of Cockroach Allergen Cockroach allergen has been identified as an important cause of acute attacks of asthma, especially in urban settings.  There are fifty-five species of cockroach that exist in the [place], however only three, the [place], [place] species produce allergen that can affect patients with Asthma.  Allergens can be obtained from fecal particles, egg casings and secretions from cockroaches.    Remove food sources. Reduce access to water. Seal access and entry points. Spray runways with 0.5-1% Diazinon or Chlorpyrifos Blow boric acid power under stoves and refrigerator. Place bait stations (hydramethylnon) at feeding sites.  No red flag

## 2023-10-15 ENCOUNTER — Other Ambulatory Visit: Payer: Self-pay | Admitting: Family Medicine

## 2023-10-15 NOTE — Telephone Encounter (Signed)
Pharmacy Patient Advocate Encounter  Received notification from [facility] Medicare Part D that Prior Authorization for Levocetirizine Dihydrochloride 5MG  tablets has been APPROVED from 10-09-2023 to 11-24-2024   PA #/Case ID/Reference #: [id number]

## 2023-10-15 NOTE — Telephone Encounter (Signed)
Called the pharmacy and let them know of the PA approval. Pharmacist stated that she could not run it because the patient has already picked up the medication and it did go through with the insurance.

## 2023-10-29 NOTE — Telephone Encounter (Signed)
I have not ever seen this patient. It looks like Thurston Hole saw him last

## 2023-10-29 NOTE — Telephone Encounter (Signed)
Issue has been address in another encounter feed.

## 2023-11-04 ENCOUNTER — Ambulatory Visit: Payer: Medicare Other

## 2023-11-04 DIAGNOSIS — J4540 Moderate persistent asthma, uncomplicated: Secondary | ICD-10-CM | POA: Diagnosis not present

## 2023-11-16 ENCOUNTER — Other Ambulatory Visit: Payer: Self-pay | Admitting: Family Medicine

## 2023-11-17 DIAGNOSIS — K267 Chronic duodenal ulcer without hemorrhage or perforation: Secondary | ICD-10-CM | POA: Diagnosis not present

## 2023-11-17 DIAGNOSIS — E7800 Pure hypercholesterolemia, unspecified: Secondary | ICD-10-CM | POA: Diagnosis not present

## 2023-11-17 DIAGNOSIS — N1831 Chronic kidney disease, stage 3a: Secondary | ICD-10-CM | POA: Diagnosis not present

## 2023-11-17 DIAGNOSIS — K257 Chronic gastric ulcer without hemorrhage or perforation: Secondary | ICD-10-CM | POA: Diagnosis not present

## 2023-11-17 DIAGNOSIS — E039 Hypothyroidism, unspecified: Secondary | ICD-10-CM | POA: Diagnosis not present

## 2023-11-24 ENCOUNTER — Emergency Department (HOSPITAL_COMMUNITY): Payer: Medicare Other

## 2023-11-24 ENCOUNTER — Other Ambulatory Visit: Payer: Self-pay

## 2023-11-24 ENCOUNTER — Emergency Department (HOSPITAL_COMMUNITY)
Admission: EM | Admit: 2023-11-24 | Discharge: 2023-11-24 | Disposition: A | Discharge status: Home / Self Care | Payer: Medicare Other | Attending: Emergency Medicine | Admitting: Emergency Medicine

## 2023-11-24 DIAGNOSIS — S81852A Open bite, left lower leg, initial encounter: Secondary | ICD-10-CM | POA: Insufficient documentation

## 2023-11-24 DIAGNOSIS — Z0389 Encounter for observation for other suspected diseases and conditions ruled out: Secondary | ICD-10-CM | POA: Diagnosis not present

## 2023-11-24 DIAGNOSIS — S61552A Open bite of left wrist, initial encounter: Secondary | ICD-10-CM | POA: Diagnosis not present

## 2023-11-24 DIAGNOSIS — R58 Hemorrhage, not elsewhere classified: Secondary | ICD-10-CM | POA: Diagnosis not present

## 2023-11-24 DIAGNOSIS — S8992XA Unspecified injury of left lower leg, initial encounter: Secondary | ICD-10-CM | POA: Diagnosis present

## 2023-11-24 DIAGNOSIS — M7989 Other specified soft tissue disorders: Secondary | ICD-10-CM | POA: Diagnosis not present

## 2023-11-24 DIAGNOSIS — Z79899 Other long term (current) drug therapy: Secondary | ICD-10-CM | POA: Diagnosis not present

## 2023-11-24 DIAGNOSIS — W540XXA Bitten by dog, initial encounter: Secondary | ICD-10-CM | POA: Insufficient documentation

## 2023-11-24 DIAGNOSIS — Z23 Encounter for immunization: Secondary | ICD-10-CM | POA: Diagnosis not present

## 2023-11-24 DIAGNOSIS — W19XXXA Unspecified fall, initial encounter: Secondary | ICD-10-CM | POA: Diagnosis not present

## 2023-11-24 LAB — CBG MONITORING, ED: Glucose-Capillary: 102 mg/dL — ABNORMAL HIGH (ref 70–99)

## 2023-11-24 MED ORDER — AMOXICILLIN-POT CLAVULANATE 875-125 MG PO TABS
1.0000 | ORAL_TABLET | Freq: Once | ORAL | Status: AC
  Administered 2023-11-24: 1 via ORAL
  Filled 2023-11-24: qty 1

## 2023-11-24 MED ORDER — TETANUS-DIPHTH-ACELL PERTUSSIS 5-2.5-18.5 LF-MCG/0.5 IM SUSY
0.5000 mL | PREFILLED_SYRINGE | Freq: Once | INTRAMUSCULAR | Status: AC
  Administered 2023-11-24: 0.5 mL via INTRAMUSCULAR
  Filled 2023-11-24: qty 0.5

## 2023-11-24 MED ORDER — OXYCODONE-ACETAMINOPHEN 5-325 MG PO TABS: 1.0000 | ORAL_TABLET | Freq: Four times a day (QID) | ORAL | 0 refills | Status: AC | PRN

## 2023-11-24 MED ORDER — AMOXICILLIN-POT CLAVULANATE 875-125 MG PO TABS: 1.0000 | ORAL_TABLET | Freq: Two times a day (BID) | ORAL | 0 refills | Status: AC

## 2023-11-24 MED ORDER — LIDOCAINE HCL (PF) 1 % IJ SOLN
5.0000 mL | Freq: Once | INTRAMUSCULAR | Status: AC
  Administered 2023-11-24: 5 mL
  Filled 2023-11-24: qty 5

## 2023-11-24 MED ORDER — OXYCODONE HCL 5 MG PO TABS
5.0000 mg | ORAL_TABLET | Freq: Once | ORAL | Status: AC
  Administered 2023-11-24: 5 mg via ORAL
  Filled 2023-11-24: qty 1

## 2023-11-24 NOTE — ED Provider Notes (Incomplete)
[facility]   CSN: [id number] Arrival date & time: 11/24/23  1416     History {Add pertinent medical, surgical, social history, OB history to HPI:1} Chief Complaint  Patient presents with   Animal Bite    Joshua Haney is a 67 y.o. male.   Animal Bite      Home Medications Prior to Admission medications   Medication Sig Start Date End Date Taking? Authorizing Provider  albuterol (VENTOLIN HFA) 108 (90 Base) MCG/ACT inhaler Inhale 2 puffs into the lungs every 6 (six) hours as needed for wheezing or shortness of breath.    [provider]  Budeson-Glycopyrrol-Formoterol (BREZTRI AEROSPHERE) 160-9-4.8 MCG/ACT AERO Inhale 2 puffs into the lungs in the morning and at bedtime. 04/14/23   [name], MD  clobetasol ointment (TEMOVATE) 0.05 % Apply 1 Application topically 2 (two) times daily as needed. DO NOT use on the face. 04/10/23   [name], MD  co-enzyme Q-10 30 MG capsule Take 100 mg by mouth 2 (two) times daily.    [provider]  famotidine (PEPCID) 20 MG tablet TAKE 1 TABLET (20 MG TOTAL) BY MOUTH 2 (TWO) TIMES DAILY AS NEEDED FOR HEARTBURN OR INDIGESTION. 11/17/23   [name], FNP  fexofenadine (ALLEGRA) 180 MG tablet Take 1 tablet (180 mg total) by mouth in the morning and at bedtime. 01/09/23   [name], MD  FIBER PO Take 500 mg by mouth.    [provider]  Garlic 1000 MG CAPS Take by mouth.    [provider]  glucosamine-chondroitin 500-400 MG tablet Take 1 tablet by mouth daily.    [provider]  ipratropium (ATROVENT) 0.03 % nasal spray Place 2 sprays into both nostrils 2 (two) times daily. 04/14/23   [name], MD  levocetirizine (XYZAL) 5 MG tablet TAKE 1 TABLET BY MOUTH TWICE A DAY 10/15/23   [name], [name], FNP  levothyroxine (SYNTHROID) 75 MCG tablet Take 75 mcg by mouth every morning. 11/23/21   [provider]  losartan (COZAAR) 50 MG tablet Take 50 mg by mouth daily.    [provider]  Multiple Vitamins-Minerals (MENS MULTIVITAMIN PLUS PO) Take by mouth.    [provider]  omalizumab [name]) 150 MG/ML prefilled syringe Inject 300 mg into the skin every 14 (fourteen) days. 09/25/23   Kozlow, Alvira Philips, MD  Omega-3 1000 MG CAPS Take by mouth daily.    [provider]  pantoprazole (PROTONIX) 40 MG tablet Take 1 tablet (40 mg total) by mouth 2 (two) times daily. 04/14/23 05/14/23  [name], MD  rosuvastatin (CRESTOR) 40 MG tablet Take 1 tablet (40 mg total) by mouth daily. 10/15/21   Quintella Reichert, MD  Saw Palmetto 450 MG CAPS Take by mouth daily.    [provider]  VITAMIN D PO Take 25 mcg by mouth.    [provider]      Allergies    Patient has no known allergies.    Review of Systems   Review of Systems  Physical Exam Updated Vital Signs BP 133/79   Pulse 87   Temp (!) 97.5 F (36.4 C) (Oral)   Resp 16   SpO2 97%  Physical Exam  ED Results / Procedures / Treatments   Labs (all labs ordered are listed, but only abnormal results are displayed) Labs Reviewed  CBG MONITORING, ED - Abnormal; Notable for the  following components:      Result Value   Glucose-Capillary 102 (*)    All other components within normal limits    EKG None  Radiology DG Wrist Complete Left Result Date: 11/24/2023 CLINICAL DATA:  Dog bite EXAM: LEFT WRIST - COMPLETE 3+ VIEW COMPARISON:  None Available. FINDINGS: Frontal, oblique, and lateral views of the left wrist are obtained. No fracture, subluxation, or dislocation. Joint spaces are well preserved. Soft tissue swelling, with no evidence of radiopaque foreign body. IMPRESSION: 1. Soft tissue swelling.  No radiopaque foreign body or fracture. Electronically Signed   By: [facility] M.D.   On: 11/24/2023 17:00    Procedures Wound repair  Date/Time: 11/24/2023 9:30  PM  Performed by: [name], PA-C Authorized by: [name], PA-C  Consent: Verbal consent obtained. Risks and benefits: risks, benefits and alternatives were discussed Consent given by: patient     {Document cardiac monitor, telemetry assessment procedure when appropriate:1}  Medications Ordered in ED Medications  amoxicillin-clavulanate (AUGMENTIN) 875-125 MG per tablet 1 tablet (1 tablet Oral Given 11/24/23 1506)  Tdap (BOOSTRIX) injection 0.5 mL (0.5 mLs Intramuscular Given 11/24/23 1507)  lidocaine (PF) (XYLOCAINE) 1 % injection 5 mL (5 mLs Other Given by Other 11/24/23 2148)  oxyCODONE (Oxy IR/ROXICODONE) immediate release tablet 5 mg (5 mg Oral Given 11/24/23 2148)    ED Course/ Medical Decision Making/ A&P Clinical Course as of 11/24/23 2218  Mon Nov 24, 2023  2040 Patient became lightheaded. Hasn't eaten or drinking today. CBG checked and within normal limits. Patient given juice and sandwich and is feeling better. Denies any chest pain or SOB.  [RR]    Clinical Course User Index [RR] [name], PA-C   {   Click here for ABCD2, HEART and other calculatorsREFRESH Note before signing :1}                              Medical Decision Making Amount and/or Complexity of Data Reviewed Radiology: ordered.  Risk Prescription drug management.   ***  {Document critical care time when appropriate:1} {Document review of labs and clinical decision tools ie heart score, Chads2Vasc2 etc:1}  {Document your independent review of radiology images, and any outside records:1} {Document your discussion with family members, caretakers, and with consultants:1} {Document social determinants of health affecting pt's care:1} {Document your decision making why or why not admission, treatments were needed:1} Final Clinical Impression(s) / ED Diagnoses Final diagnoses:  None    Rx / DC Orders ED Discharge Orders     None

## 2023-11-24 NOTE — ED Provider Triage Note (Signed)
Emergency Medicine Provider Triage Evaluation Note  Joshua Haney , a 67 y.o. male  was evaluated in triage.  Pt complains of dog bite.  Patient was bitten by his son's dog.  The dog is up-to-date on rabies shots.  He was bitten on the left wrist with 2 lacerations and bitten on the left leg with some more superficial abrasions.  Denies any numbness or tingling.  He fell landing on the left elbow but no severe pain in that location.   Review of Systems  Positive: Dog bite wounds Negative: Numbness  Physical Exam  There were no vitals taken for this visit. Gen:   Awake, no distress   Resp:  Normal effort  MSK:   Moves extremities without difficulty  Other:  Abrasion to the left elbow and left knee with superficial wounds.  There is a deeper laceration along the volar surface of the left wrist but the wound is hemostatic.  Palpable pulse.  No hard sign of neurovascular damage.  Additional laceration along the dorsal aspect of the wrist.  Medical Decision Making  Medically screening exam initiated at 2:49 PM.  Appropriate orders placed.  Anselm Pancoast was informed that the remainder of the evaluation will be completed by another provider, this initial triage assessment does not replace that evaluation, and the importance of remaining in the ED until their evaluation is complete.     Maia Plan, MD 11/24/23 [phone]

## 2023-11-24 NOTE — ED Triage Notes (Signed)
BIB EMS from home after sons dog bit pt on left wrist. Pt states large amount of blood initially from wrist. Wrist is currently wrapped with guave and bleeding controlled. Pt has skin tear to left elbow from falling into dog gate after being bit. Pt denies any pain, no blood thinner. Dog is up to date on all shots. Per EMS puncture to left inner wrist is deep.

## 2023-11-24 NOTE — ED Notes (Signed)
Pt given snacks; asking about pain control at home

## 2023-11-24 NOTE — Discharge Instructions (Addendum)
You were seen in the ER today for evaluation of your dog bite. Please make sure that you are remembering to keep your wound clean daily with Dial soap and water and daily bandage changes. I recommend keeping the wound covered until you are seen by hand surgery. Do not expose the wound to any dishwater, pools, lakes, oceans, [employer], dirt or grime. Keeping the wound clean and away from contamination can help ensure good wound healing and help to prevent infections.For pain, I recommend Tylenol 650mg  every 6 hours as needed for pain. I have also sent you in a prescription of narcotic medication for break through pain. Please do not drive or operate heavy machinery while on this medication as it will make you sleepy. It can also make you constipated as well. Please make sure to take your antibiotic as prescribed and complete the entirety of the course. Let your PCP know that we updated your tetanus shot for you today. I have listed the information for the hand surgeon, Dr. Kerry Fort. Please call to schedule an appointment.  If you have any concerns, new or worsening symptoms, please return to the nearest ER for re-evaluation.   Contact a doctor if: You have more redness, swelling, or pain around your wound. Your wound feels warm to the touch. You have a fever or chills. You have a general feeling of sickness (malaise). You feel like you may vomit. You vomit. You have pain that does not get better. Get help right away if: You have a red streak going away from your wound. You have any of these coming from your wound: Non-clear fluid. More blood. Pus or a bad smell. You have trouble moving your injured area. You lose feeling (have numbness) or feel tingling anywhere on your body.

## 2023-11-27 DIAGNOSIS — W540XXA Bitten by dog, initial encounter: Secondary | ICD-10-CM | POA: Diagnosis not present

## 2023-11-27 DIAGNOSIS — S61552A Open bite of left wrist, initial encounter: Secondary | ICD-10-CM | POA: Diagnosis not present

## 2023-12-02 ENCOUNTER — Ambulatory Visit: Payer: Medicare Other

## 2023-12-02 DIAGNOSIS — J4540 Moderate persistent asthma, uncomplicated: Secondary | ICD-10-CM | POA: Diagnosis not present

## 2023-12-05 DIAGNOSIS — W540XXA Bitten by dog, initial encounter: Secondary | ICD-10-CM | POA: Diagnosis not present

## 2023-12-05 DIAGNOSIS — S61552A Open bite of left wrist, initial encounter: Secondary | ICD-10-CM | POA: Diagnosis not present

## 2023-12-12 DIAGNOSIS — S61552A Open bite of left wrist, initial encounter: Secondary | ICD-10-CM | POA: Diagnosis not present

## 2023-12-12 DIAGNOSIS — W540XXA Bitten by dog, initial encounter: Secondary | ICD-10-CM | POA: Diagnosis not present

## 2023-12-24 DIAGNOSIS — S61552A Open bite of left wrist, initial encounter: Secondary | ICD-10-CM | POA: Diagnosis not present

## 2023-12-24 DIAGNOSIS — W540XXA Bitten by dog, initial encounter: Secondary | ICD-10-CM | POA: Diagnosis not present

## 2023-12-30 ENCOUNTER — Ambulatory Visit: Payer: Medicare Other | Admitting: *Deleted

## 2023-12-30 DIAGNOSIS — J4540 Moderate persistent asthma, uncomplicated: Secondary | ICD-10-CM

## 2024-01-27 ENCOUNTER — Ambulatory Visit: Payer: Medicare Other

## 2024-01-27 DIAGNOSIS — J4540 Moderate persistent asthma, uncomplicated: Secondary | ICD-10-CM

## 2024-02-03 DIAGNOSIS — S61552A Open bite of left wrist, initial encounter: Secondary | ICD-10-CM | POA: Diagnosis not present

## 2024-02-03 DIAGNOSIS — W540XXA Bitten by dog, initial encounter: Secondary | ICD-10-CM | POA: Diagnosis not present

## 2024-02-24 ENCOUNTER — Ambulatory Visit

## 2024-02-24 DIAGNOSIS — J4540 Moderate persistent asthma, uncomplicated: Secondary | ICD-10-CM

## 2024-03-03 ENCOUNTER — Other Ambulatory Visit: Payer: Self-pay | Admitting: Allergy & Immunology

## 2024-03-03 DIAGNOSIS — L578 Other skin changes due to chronic exposure to nonionizing radiation: Secondary | ICD-10-CM | POA: Diagnosis not present

## 2024-03-03 DIAGNOSIS — L720 Epidermal cyst: Secondary | ICD-10-CM | POA: Diagnosis not present

## 2024-03-03 DIAGNOSIS — D170 Benign lipomatous neoplasm of skin and subcutaneous tissue of head, face and neck: Secondary | ICD-10-CM | POA: Diagnosis not present

## 2024-03-22 ENCOUNTER — Encounter: Payer: Self-pay | Admitting: Allergy & Immunology

## 2024-03-22 ENCOUNTER — Other Ambulatory Visit: Payer: Self-pay

## 2024-03-22 MED ORDER — IPRATROPIUM BROMIDE 0.03 % NA SOLN: 2.0000 | Freq: Two times a day (BID) | NASAL | 1 refills | Status: DC

## 2024-03-23 ENCOUNTER — Ambulatory Visit

## 2024-03-23 DIAGNOSIS — J4540 Moderate persistent asthma, uncomplicated: Secondary | ICD-10-CM

## 2024-03-25 DIAGNOSIS — M79642 Pain in left hand: Secondary | ICD-10-CM | POA: Diagnosis not present

## 2024-03-25 DIAGNOSIS — H9311 Tinnitus, right ear: Secondary | ICD-10-CM | POA: Diagnosis not present

## 2024-03-25 DIAGNOSIS — S60222A Contusion of left hand, initial encounter: Secondary | ICD-10-CM | POA: Diagnosis not present

## 2024-03-29 DIAGNOSIS — H9121 Sudden idiopathic hearing loss, right ear: Secondary | ICD-10-CM | POA: Diagnosis not present

## 2024-03-29 DIAGNOSIS — H903 Sensorineural hearing loss, bilateral: Secondary | ICD-10-CM | POA: Diagnosis not present

## 2024-04-13 ENCOUNTER — Encounter: Payer: Self-pay | Admitting: Allergy & Immunology

## 2024-04-13 ENCOUNTER — Ambulatory Visit: Payer: Medicare Other | Admitting: Allergy & Immunology

## 2024-04-13 ENCOUNTER — Other Ambulatory Visit: Payer: Self-pay

## 2024-04-13 VITALS — BP 126/70 | HR 73 | Temp 98.0°F | Resp 18 | Ht 66.0 in | Wt 184.3 lb

## 2024-04-13 DIAGNOSIS — J4540 Moderate persistent asthma, uncomplicated: Secondary | ICD-10-CM | POA: Diagnosis not present

## 2024-04-13 DIAGNOSIS — J3089 Other allergic rhinitis: Secondary | ICD-10-CM

## 2024-04-13 DIAGNOSIS — K219 Gastro-esophageal reflux disease without esophagitis: Secondary | ICD-10-CM | POA: Diagnosis not present

## 2024-04-13 DIAGNOSIS — D4709 Other mast cell neoplasms of uncertain behavior: Secondary | ICD-10-CM

## 2024-04-13 MED ORDER — IPRATROPIUM BROMIDE 0.03 % NA SOLN: 2.0000 | Freq: Two times a day (BID) | NASAL | 1 refills | Status: DC

## 2024-04-13 NOTE — Progress Notes (Signed)
 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 Dione Franks)   Perennial allergic rhinitis (dust mites, cockroach)   GERD - increasing pantoprazole  to BID dosing  Plan/Recommendations:   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  - Continue with Allegra  twice daily and then transition to Xyzal  twice daily.  2. SOB (shortness of breath) - Lung testing looks phenomenal today.  - Consider doing to see Cardiology.  - Daily controller medication(s): Breztri  two puffs twice daily with spacer - Prior to physical activity: albuterol  2 puffs 10-15 minutes before physical activity. - Rescue medications: albuterol  4 puffs every 4-6 hours as needed - Asthma control goals:  * Full participation in all desired activities (may need albuterol  before activity) * Albuterol  use two time or less a week on average (not counting use with activity) * Cough interfering with sleep two time or less a month * Oral steroids no more than once a year * No hospitalizations  3. Perennial allergic rhinitis (dust mites, cockroach) - Continue ipratropium one spray per nostril twice daily.   4. GERD - Continue with Protonix  twice daily   5. Return in about 6 months (around 10/14/2024). You can have the follow up appointment with Dr. Idolina Maker or a Nurse Practicioner (our Nurse Practitioners are excellent and always have Physician oversight!).   Subjective:   Joshua Haney is a 68 y.o. male presenting today for follow up of  Chief Complaint  Patient presents with   Follow-up    Patient states that overall they are doing well and their asthma and allergies have been stable with no reported issues.     Joshua Haney has a history of the following: Patient Active Problem List   Diagnosis Date Noted   Idiopathic urticaria 08/22/2023   Gastroesophageal reflux disease 08/22/2023   OSA (obstructive sleep apnea) 07/03/2021   Moderate persistent asthma without complication 07/03/2021   Mastocytosis 05/22/2021   Perennial allergic rhinitis 05/22/2021   SOB (shortness of breath) 05/22/2021    History obtained from: chart review and patient and his wife.  Discussed the use of AI scribe software for clinical note transcription with the patient and/or guardian, who gave verbal consent to proceed.  [name] is a 68 y.o. male presenting for a follow up visit.  He was last seen in November 2024.  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  For his shortness of breath, he was continued on Breztri  2 puffs twice daily as well as albuterol .  For his allergic rhinitis, we continue with an antihistamine as well as Atrovent  nasal saline.  GERD was controlled with Protonix  twice daily.  For his hives, he was doing well with antihistamines and famotidine .  Since last visit, he has done well.  Asthma/Respiratory Symptom History: He experiences fatigue and shortness of breath upon exertion, with a significant decrease in energy levels. He feels breathless with minimal activity. He attributes some symptoms to an abdominal condition he describes as 'not quite herniated'.  Allergic Rhinitis Symptom History: He experiences hoarseness, which he associates with CPAP machine use. The hoarseness varies and worsens  with increased talking. Medication adjustments, including reduced antihistamines, have been made to address this.  GERD Symptom History: He has a history of gastrointestinal issues, including heartburn and ulcers, managed with daily Protonix  and famotidine . Eating excessively causes abdominal discomfort and a burning  sensation. Previous endoscopies revealed ulcers, which are managed with acid suppression therapy. His brothers have had similar abdominal issues requiring surgery.  He has had no problems with the hives since the last time we saw him.  Xolair  seems to be working well to control his symptoms.  He has a history of muscle weakness and pain, attributed to cholesterol medication. Reduction in medication dosage has alleviated some muscle pain, and he takes CoQ10 twice daily to manage symptoms.  He has had a challenging year with incidents such as a dog bite, a tire blowout causing tinnitus, and injuries including broken ribs and a shoulder injury. He feels weak, and his spouse believes this may be due to insufficient protein intake, as he often skips meals. He uses a CPAP machine for breathing at night.   Otherwise, there have been no changes to his past medical history, surgical history, family history, or social history.    Review of systems otherwise negative other than that mentioned in the HPI.    Objective:   Blood pressure 126/70, pulse 73, temperature 98 F (36.7 C), temperature source Temporal, resp. rate 18, height 5\' 6"  (1.676 m), weight 184 lb 4.8 oz (83.6 kg), SpO2 97%. Body mass index is 29.75 kg/m.    Physical Exam Vitals reviewed.  Constitutional:      Appearance: He is well-developed.     Comments: Very pleasant male.  Hard of hearing as per his usual. Joyful.   HENT:     Head: Normocephalic and atraumatic.     Right Ear: Tympanic membrane, ear canal and external ear normal.     Left Ear: Tympanic membrane, ear canal and external ear normal.     Nose: No nasal deformity, septal deviation, mucosal edema or rhinorrhea.     Right Turbinates: Enlarged, swollen and pale.     Left Turbinates: Enlarged, swollen and pale.     Right Sinus: No maxillary sinus tenderness or frontal sinus tenderness.     Left Sinus: No maxillary sinus tenderness or frontal sinus tenderness.      Comments: No nasal polyps noted.     Mouth/Throat:     Mouth: Mucous membranes are not pale and not dry.     Pharynx: Uvula midline.     Comments: Throat seems well-hydrated today.  Oropharynx without cobblestoning. Eyes:     General: Lids are normal. No allergic shiner.       Right eye: No discharge.        Left eye: No discharge.     Conjunctiva/sclera: Conjunctivae normal.     Right eye: Right conjunctiva is not injected. No chemosis.    Left eye: Left conjunctiva is not injected. No chemosis.    Pupils: Pupils are equal, round, and reactive to light.  Cardiovascular:     Rate and Rhythm: Normal rate and regular rhythm.     Heart sounds: Normal heart sounds.  Pulmonary:     Effort: Pulmonary effort is normal. No tachypnea, accessory muscle usage or respiratory distress.     Breath sounds: Normal breath sounds. No wheezing, rhonchi or rales.     Comments: Hoarse.  Moving air well in all lung fields.  No increased work of breathing. Chest:  Chest wall: No tenderness.  Lymphadenopathy:     Cervical: No cervical adenopathy.  Skin:    General: Skin is warm.     Capillary Refill: Capillary refill takes less than 2 seconds.     Coloration: Skin is not pale.     Findings: No abrasion, erythema, petechiae or rash. Rash is not papular, urticarial or vesicular.     Comments: No eczematous or urticarial lesions noted.  No excoriations.  Neurological:     Mental Status: He is alert.  Psychiatric:        Behavior: Behavior is cooperative.      Diagnostic studies:    Spirometry: results normal (FEV1: 2.86/101%, FVC: 3.56/96%, FEV1/FVC: 80%).    Spirometry consistent with normal pattern.   Allergy Studies: none       Drexel Gentles, MD  [facility] 

## 2024-04-13 NOTE — Patient Instructions (Signed)
 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  - Continue with Allegra  twice daily and then transition to Xyzal  twice daily.  2. SOB (shortness of breath) - Lung testing looks phenomenal today.  - Consider doing to see Cardiology.  - Daily controller medication(s): Breztri  two puffs twice daily with spacer - Prior to physical activity: albuterol  2 puffs 10-15 minutes before physical activity. - Rescue medications: albuterol  4 puffs every 4-6 hours as needed - Asthma control goals:  * Full participation in all desired activities (may need albuterol  before activity) * Albuterol  use two time or less a week on average (not counting use with activity) * Cough interfering with sleep two time or less a month * Oral steroids no more than once a year * No hospitalizations  3. Perennial allergic rhinitis (dust mites, cockroach) - Continue ipratropium one spray per nostril twice daily.   4. GERD - Continue with Protonix  twice daily   5. Return in about 6 months (around 10/14/2024). You can have the follow up appointment with Dr. Idolina Maker or a Nurse Practicioner (our Nurse Practitioners are excellent and always have Physician oversight!).    Please inform us  of any Emergency Department visits, hospitalizations, or changes in symptoms. Call us  before going to the ED for breathing or allergy symptoms since we might be able to fit you in for a sick visit. Feel free to contact us  anytime with any questions, problems, or concerns.  It was a pleasure to see you and your family again today!  Websites that have reliable patient information: 1. American Academy of Asthma, Allergy, and Immunology: www.aaaai.org 2. Food Allergy Research and Education (FARE): foodallergy.org 3. Mothers of Asthmatics: http://www.asthmacommunitynetwork.org 4. American College of Allergy, Asthma, and Immunology: www.acaai.org      "Like" us  on Facebook and Instagram for our latest updates!       A healthy democracy works best when [employer] participate! Make sure you are registered to vote! If you have moved or changed any of your contact information, you will need to get this updated before voting! Scan the QR codes below to learn more!

## 2024-04-18 ENCOUNTER — Other Ambulatory Visit: Payer: Self-pay | Admitting: Allergy & Immunology

## 2024-04-20 ENCOUNTER — Ambulatory Visit

## 2024-04-21 ENCOUNTER — Encounter: Payer: Self-pay | Admitting: Allergy & Immunology

## 2024-04-22 ENCOUNTER — Ambulatory Visit: Admitting: *Deleted

## 2024-04-22 DIAGNOSIS — J4540 Moderate persistent asthma, uncomplicated: Secondary | ICD-10-CM

## 2024-04-28 ENCOUNTER — Other Ambulatory Visit: Payer: Self-pay | Admitting: Family Medicine

## 2024-05-20 ENCOUNTER — Ambulatory Visit

## 2024-05-20 DIAGNOSIS — Z139 Encounter for screening, unspecified: Secondary | ICD-10-CM | POA: Diagnosis not present

## 2024-05-20 DIAGNOSIS — Z9181 History of falling: Secondary | ICD-10-CM | POA: Diagnosis not present

## 2024-05-20 DIAGNOSIS — E039 Hypothyroidism, unspecified: Secondary | ICD-10-CM | POA: Diagnosis not present

## 2024-05-20 DIAGNOSIS — M545 Low back pain, unspecified: Secondary | ICD-10-CM | POA: Diagnosis not present

## 2024-05-20 DIAGNOSIS — E7800 Pure hypercholesterolemia, unspecified: Secondary | ICD-10-CM | POA: Diagnosis not present

## 2024-05-20 DIAGNOSIS — I10 Essential (primary) hypertension: Secondary | ICD-10-CM | POA: Diagnosis not present

## 2024-05-20 DIAGNOSIS — J4540 Moderate persistent asthma, uncomplicated: Secondary | ICD-10-CM | POA: Diagnosis not present

## 2024-05-24 ENCOUNTER — Ambulatory Visit: Admitting: *Deleted

## 2024-05-24 DIAGNOSIS — J4540 Moderate persistent asthma, uncomplicated: Secondary | ICD-10-CM

## 2024-06-17 ENCOUNTER — Other Ambulatory Visit: Payer: Self-pay | Admitting: Family Medicine

## 2024-06-21 ENCOUNTER — Ambulatory Visit: Admitting: *Deleted

## 2024-06-21 DIAGNOSIS — E7800 Pure hypercholesterolemia, unspecified: Secondary | ICD-10-CM | POA: Diagnosis not present

## 2024-06-21 DIAGNOSIS — J4540 Moderate persistent asthma, uncomplicated: Secondary | ICD-10-CM

## 2024-06-21 DIAGNOSIS — E039 Hypothyroidism, unspecified: Secondary | ICD-10-CM | POA: Diagnosis not present

## 2024-06-21 DIAGNOSIS — N1831 Chronic kidney disease, stage 3a: Secondary | ICD-10-CM | POA: Diagnosis not present

## 2024-07-19 ENCOUNTER — Ambulatory Visit: Admitting: *Deleted

## 2024-07-19 DIAGNOSIS — J4540 Moderate persistent asthma, uncomplicated: Secondary | ICD-10-CM | POA: Diagnosis not present

## 2024-07-27 ENCOUNTER — Telehealth: Payer: Self-pay | Admitting: Allergy & Immunology

## 2024-07-27 NOTE — Telephone Encounter (Signed)
 Cipratropium (ATROVENT ) 0.03 % nasal spray [[id number] needs this sent in as a 3 month supply vs just 1 month at at time. Pt said they have asked multiple times to get this sent in this way, and it keeps being sent in as one month at a time [facility] in [place]

## 2024-07-29 NOTE — Telephone Encounter (Signed)
 I called and had to leave a voicemail for a return call. I also looked at the last prescription that was sent in for ipratropium (atrovent ) nasal spray was indeed 225 day supply. So I wanted to confirm with the patient that he is using the medication as he is prescribed.

## 2024-07-30 NOTE — Telephone Encounter (Signed)
 I called and spoke with [facility] and she stated that patient was refilling 30 mL prescription instead of the 90 mL prescription. I called and spoke with patient to inform him of this confusion. I stated to the patient 3 month supply was sent on 04/13/2024 for him at his request. Patient verbally understood.

## 2024-08-16 ENCOUNTER — Ambulatory Visit

## 2024-08-18 ENCOUNTER — Other Ambulatory Visit: Payer: Self-pay | Admitting: Allergy & Immunology

## 2024-08-23 ENCOUNTER — Ambulatory Visit

## 2024-08-23 DIAGNOSIS — J4540 Moderate persistent asthma, uncomplicated: Secondary | ICD-10-CM

## 2024-09-13 DIAGNOSIS — S0181XA Laceration without foreign body of other part of head, initial encounter: Secondary | ICD-10-CM | POA: Diagnosis not present

## 2024-09-13 DIAGNOSIS — S0083XA Contusion of other part of head, initial encounter: Secondary | ICD-10-CM | POA: Diagnosis not present

## 2024-09-20 ENCOUNTER — Ambulatory Visit: Admitting: *Deleted

## 2024-09-20 DIAGNOSIS — J4540 Moderate persistent asthma, uncomplicated: Secondary | ICD-10-CM

## 2024-10-18 ENCOUNTER — Ambulatory Visit (INDEPENDENT_AMBULATORY_CARE_PROVIDER_SITE_OTHER): Admitting: *Deleted

## 2024-10-18 DIAGNOSIS — J4540 Moderate persistent asthma, uncomplicated: Secondary | ICD-10-CM

## 2024-10-19 ENCOUNTER — Ambulatory Visit: Admitting: Allergy & Immunology

## 2024-10-19 ENCOUNTER — Encounter: Payer: Self-pay | Admitting: Allergy & Immunology

## 2024-10-19 ENCOUNTER — Other Ambulatory Visit: Payer: Self-pay

## 2024-10-19 ENCOUNTER — Ambulatory Visit: Admitting: Allergy and Immunology

## 2024-10-19 ENCOUNTER — Other Ambulatory Visit: Payer: Self-pay | Admitting: Allergy & Immunology

## 2024-10-19 VITALS — BP 132/70 | HR 79 | Temp 97.9°F | Ht 66.5 in | Wt 184.6 lb

## 2024-10-19 DIAGNOSIS — J3089 Other allergic rhinitis: Secondary | ICD-10-CM

## 2024-10-19 DIAGNOSIS — J4540 Moderate persistent asthma, uncomplicated: Secondary | ICD-10-CM | POA: Diagnosis not present

## 2024-10-19 DIAGNOSIS — D4709 Other mast cell neoplasms of uncertain behavior: Secondary | ICD-10-CM

## 2024-10-19 DIAGNOSIS — K219 Gastro-esophageal reflux disease without esophagitis: Secondary | ICD-10-CM

## 2024-10-19 DIAGNOSIS — L501 Idiopathic urticaria: Secondary | ICD-10-CM

## 2024-10-19 MED ORDER — LEVOCETIRIZINE DIHYDROCHLORIDE 5 MG PO TABS: 5.0000 mg | ORAL_TABLET | Freq: Two times a day (BID) | ORAL | 1 refills | Status: AC

## 2024-10-19 MED ORDER — CLOBETASOL PROPIONATE 0.05 % EX OINT
1.0000 | TOPICAL_OINTMENT | Freq: Two times a day (BID) | CUTANEOUS | 5 refills | Status: AC | PRN
Start: 2024-10-19 — End: ?

## 2024-10-19 MED ORDER — IPRATROPIUM BROMIDE 0.03 % NA SOLN: 2.0000 | Freq: Two times a day (BID) | NASAL | 1 refills | Status: DC

## 2024-10-19 MED ORDER — PANTOPRAZOLE SODIUM 40 MG PO TBEC: 40.0000 mg | DELAYED_RELEASE_TABLET | Freq: Two times a day (BID) | ORAL | 5 refills | Status: AC

## 2024-10-19 MED ORDER — BREZTRI AEROSPHERE 160-9-4.8 MCG/ACT IN AERO: 2.0000 | INHALATION_SPRAY | Freq: Two times a day (BID) | RESPIRATORY_TRACT | 5 refills | Status: AC

## 2024-10-19 NOTE — Patient Instructions (Addendum)
 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  - Continue with Xyzal  twice daily.  2. SOB (shortness of breath) - We did not do testing for your lungs today.  - You seem to have everything under good control.  - Daily controller medication(s): Breztri  two puffs twice daily with spacer - Prior to physical activity: albuterol  2 puffs 10-15 minutes before physical activity. - Rescue medications: albuterol  4 puffs every 4-6 hours as needed and albuterol  nebulizer one vial every 4-6 hours as needed - Asthma control goals:  * Full participation in all desired activities (may need albuterol  before activity) * Albuterol  use two time or less a week on average (not counting use with activity) * Cough interfering with sleep two time or less a month * Oral steroids no more than once a year * No hospitalizations  3. Perennial allergic rhinitis (dust mites, cockroach) - Continue ipratropium one spray per nostril twice daily.  - This will help dry up your nose.   4. GERD - Continue with Protonix  twice daily   5. Return in about 6 months (around 04/18/2025). You can have the follow up appointment with Dr. Iva or a Nurse Practicioner (our Nurse Practitioners are excellent and always have Physician oversight!).    Please inform us  of any Emergency Department visits, hospitalizations, or changes in symptoms. Call us  before going to the ED for breathing or allergy symptoms since we might be able to fit you in for a sick visit. Feel free to contact us  anytime with any questions, problems, or concerns.  It was a pleasure to see you and your family again today!  Websites that have reliable patient information: 1. American Academy of Asthma, Allergy, and Immunology: www.aaaai.org 2. Food Allergy Research and Education (FARE): foodallergy.org 3. Mothers of Asthmatics: http://www.asthmacommunitynetwork.org 4. American College of Allergy, Asthma, and Immunology:  www.acaai.org      "Like" us  on Facebook and Instagram for our latest updates!      A healthy democracy works best when [employer] participate! Make sure you are registered to vote! If you have moved or changed any of your contact information, you will need to get this updated before voting! Scan the QR codes below to learn more!         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  - Continue with Allegra  twice daily and then transition to Xyzal  twice daily.  2. SOB (shortness of breath) - Lung testing looks phenomenal today.  - Consider doing to see Cardiology.  - Daily controller medication(s): Breztri  two puffs twice daily with spacer - Prior to physical activity: albuterol  2 puffs 10-15 minutes before physical activity. - Rescue medications: albuterol  4 puffs every 4-6 hours as needed - Asthma control goals:  * Full participation in all desired activities (may need albuterol  before activity) * Albuterol  use two time or less a week on average (not counting use with activity) * Cough interfering with sleep two time or less a month * Oral steroids no more than once a year * No hospitalizations  3. Perennial allergic rhinitis (dust mites, cockroach) - Continue ipratropium one spray per nostril twice daily.   4. GERD - Continue with Protonix  twice daily   5. Return in about 6 months (around 04/18/2025). You can have the follow up appointment with Dr. Iva or a Nurse Practicioner (our Nurse Practitioners are excellent and always have Physician oversight!).    Please inform us   of any Emergency Department visits, hospitalizations, or changes in symptoms. Call us  before going to the ED for breathing or allergy symptoms since we might be able to fit you in for a sick visit. Feel free to contact us  anytime with any questions, problems, or concerns.  It was a pleasure to see you and your family again today!  Websites that have reliable patient  information: 1. American Academy of Asthma, Allergy, and Immunology: www.aaaai.org 2. Food Allergy Research and Education (FARE): foodallergy.org 3. Mothers of Asthmatics: http://www.asthmacommunitynetwork.org 4. American College of Allergy, Asthma, and Immunology: www.acaai.org      "Like" us  on Facebook and Instagram for our latest updates!      A healthy democracy works best when [employer] participate! Make sure you are registered to vote! If you have moved or changed any of your contact information, you will need to get this updated before voting! Scan the QR codes below to learn more!

## 2024-10-19 NOTE — Progress Notes (Unsigned)
 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 Meade)   Perennial allergic rhinitis (dust mites, cockroach)   GERD - increasing pantoprazole  to BID dosing No diagnosis found.  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  - Continue with Xyzal  twice daily.  2. SOB (shortness of breath) - We did not do testing for your lungs today.  - You seem to have everything under good control.  - Daily controller medication(s): Breztri  two puffs twice daily with spacer - Prior to physical activity: albuterol  2 puffs 10-15 minutes before physical activity. - Rescue medications: albuterol  4 puffs every 4-6 hours as needed and albuterol  nebulizer one vial every 4-6 hours as needed - Asthma control goals:  * Full participation in all desired activities (may need albuterol  before activity) * Albuterol  use two time or less a week on average (not counting use with activity) * Cough interfering with sleep two time or less a month * Oral steroids no more than once a year * No hospitalizations  3. Perennial allergic rhinitis (dust mites, cockroach) - Continue ipratropium one spray per nostril twice daily.  - This will help dry up your nose.   4. GERD - Continue with Protonix  twice daily   5. Return in about 6 months (around 04/18/2025). You can have the follow up appointment with Dr. Iva or a Nurse Practicioner (our Nurse Practitioners are excellent and always have Physician oversight!).    Please inform us  of any Emergency Department visits, hospitalizations, or changes in symptoms. Call us  before going to the ED for breathing or allergy  symptoms since we might be able to fit you in for a sick visit. Feel free to contact us  anytime with any questions, problems, or concerns.  It was a pleasure to see you and your family again today!  Websites that have reliable patient information: 1. American Academy of Asthma, Allergy, and Immunology: www.aaaai.org 2. Food Allergy Research and Education (FARE): foodallergy.org 3. Mothers of Asthmatics: http://www.asthmacommunitynetwork.org 4. American College of Allergy, Asthma, and Immunology: www.acaai.org      "Like" us  on Facebook and Instagram for our latest updates!      A healthy democracy works best when [employer] participate! Make sure you are registered to vote! If you have moved or changed any of your contact information, you will need to get this updated before voting! Scan the QR codes below to learn more!         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  - Continue with Allegra  twice daily and then transition to Xyzal  twice daily.  2. SOB (shortness of breath) - Lung testing looks phenomenal today.  - Consider doing to see Cardiology.  - Daily controller medication(s): Breztri  two puffs twice daily with spacer - Prior to physical activity: albuterol  2 puffs 10-15 minutes before physical activity. - Rescue medications: albuterol  4 puffs every 4-6 hours as needed - Asthma control goals:  * Full participation in all desired activities (may need albuterol  before activity) * Albuterol  use two time or less a week on average (not counting use with activity) * Cough interfering with sleep  two time or less a month * Oral steroids no more than once a year * No hospitalizations  3. Perennial allergic rhinitis (dust mites, cockroach) - Continue ipratropium one spray per nostril twice daily.   4. GERD - Continue with Protonix  twice daily   5. Return in about 6 months (around 04/18/2025). You can have the follow up appointment with Dr.  Iva or a Nurse Practicioner (our Nurse Practitioners are excellent and always have Physician oversight!).    Please inform us  of any Emergency Department visits, hospitalizations, or changes in symptoms. Call us  before going to the ED for breathing or allergy symptoms since we might be able to fit you in for a sick visit. Feel free to contact us  anytime with any questions, problems, or concerns.  It was a pleasure to see you and your family again today!  Websites that have reliable patient information: 1. American Academy of Asthma, Allergy, and Immunology: www.aaaai.org 2. Food Allergy Research and Education (FARE): foodallergy.org 3. Mothers of Asthmatics: http://www.asthmacommunitynetwork.org 4. American College of Allergy, Asthma, and Immunology: www.acaai.org      "Like" us  on Facebook and Instagram for our latest updates!      A healthy democracy works best when [employer] participate! Make sure you are registered to vote! If you have moved or changed any of your contact information, you will need to get this updated before voting! Scan the QR codes below to learn more!            Subjective:   LABARON DIGIROLAMO is a 68 y.o. male presenting today for follow up of  Chief Complaint  Patient presents with  . Allergic Rhinitis     Having sinus drainage at night when laying down.   . Asthma    Asthma has been fine.    Garnette LITTIE Hila has a history of the following: Patient Active Problem List   Diagnosis Date Noted  . Idiopathic urticaria 08/22/2023  . Gastroesophageal reflux disease 08/22/2023  . OSA (obstructive sleep apnea) 07/03/2021  . Moderate persistent asthma without complication 07/03/2021  . Mastocytosis 05/22/2021  . Perennial allergic rhinitis 05/22/2021  . SOB (shortness of breath) 05/22/2021    History obtained from: chart review and {Persons; PED relatives w/patient:19415::patient}.  Discussed the use of AI scribe software for clinical note  transcription with the patient and/or guardian, who gave verbal consent to proceed.  Nicolas is a 68 y.o. male presenting for {Blank single:19197::a food challenge,a drug challenge,skin testing,a sick visit,an evaluation of ***,a follow up visit}.  He was last seen in May 2025.  At that time, we continue with Xolair  every 4 weeks.  He was also doing Allegra  twice daily and was planning to transition to Xyzal  twice daily.  For his shortness of breath, we continue with Breztri  2 puffs twice daily as well as albuterol  as needed.  For the allergic rhinitis, we will continue with ipratropium 1 spray per nostril twice daily.  GERD was under good control with Protonix  twice daily.  Since last visit,   Asthma/Respiratory Symptom History: ***  Allergic Rhinitis Symptom History: ***  Food Allergy Symptom History: ***  Skin Symptom History: ***  GERD Symptom History: ***  Infection Symptom History: ***  Otherwise, there have been no changes to his past medical history, surgical history, family history, or social history.    Review of systems otherwise negative other than that mentioned in the HPI.    Objective:   Blood pressure 132/70, pulse 79, temperature  97.9 F (36.6 C), height 5' 6.5 (1.689 m), weight 184 lb 9.6 oz (83.7 kg), SpO2 97%. Body mass index is 29.35 kg/m.    Physical Exam   Diagnostic studies: {Blank single:19197::none,deferred due to recent antihistamine use,deferred due to insurance stipulations that require a separate visit for testing,labs sent instead, }  Spirometry: {Blank single:19197::results normal (FEV1: ***%, FVC: ***%, FEV1/FVC: ***%),results abnormal (FEV1: ***%, FVC: ***%, FEV1/FVC: ***%)}.    {Blank single:19197::Spirometry consistent with mild obstructive disease,Spirometry consistent with moderate obstructive disease,Spirometry consistent with severe obstructive disease,Spirometry consistent with possible restrictive  disease,Spirometry consistent with mixed obstructive and restrictive disease,Spirometry uninterpretable due to technique,Spirometry consistent with normal pattern}. {Blank single:19197::Albuterol /Atrovent  nebulizer,Xopenex/Atrovent  nebulizer,Albuterol  nebulizer,Albuterol  four puffs via MDI,Xopenex four puffs via MDI} treatment given in clinic with {Blank single:19197::significant improvement in FEV1 per ATS criteria,significant improvement in FVC per ATS criteria,significant improvement in FEV1 and FVC per ATS criteria,improvement in FEV1, but not significant per ATS criteria,improvement in FVC, but not significant per ATS criteria,improvement in FEV1 and FVC, but not significant per ATS criteria,no improvement}.  Allergy Studies: {Blank single:19197::none,deferred due to recent antihistamine use,deferred due to insurance stipulations that require a separate visit for testing,labs sent instead, }    {Blank single:19197::Allergy testing results were read and interpreted by myself, documented by clinical staff., }      Marty Shaggy, MD  [facility] 

## 2024-11-15 ENCOUNTER — Ambulatory Visit: Admitting: *Deleted

## 2024-11-15 DIAGNOSIS — J4540 Moderate persistent asthma, uncomplicated: Secondary | ICD-10-CM

## 2024-11-29 ENCOUNTER — Encounter: Payer: Self-pay | Admitting: Cardiology

## 2024-12-13 ENCOUNTER — Ambulatory Visit: Admitting: *Deleted

## 2024-12-13 DIAGNOSIS — J4540 Moderate persistent asthma, uncomplicated: Secondary | ICD-10-CM | POA: Diagnosis not present

## 2025-01-10 ENCOUNTER — Ambulatory Visit

## 2025-04-19 ENCOUNTER — Ambulatory Visit: Admitting: Allergy & Immunology
